# Patient Record
Sex: Female | Born: 1980 | Race: White | Hispanic: No | Marital: Married | Smoking: Former smoker
Health system: Southern US, Community
[De-identification: ages and names within clinical notes are randomized; demographics above are authoritative.]

## PROBLEM LIST (undated history)

## (undated) DIAGNOSIS — N200 Calculus of kidney: Secondary | ICD-10-CM

## (undated) HISTORY — PX: EYE SURGERY: SHX253

---
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
## 2001-01-06 ENCOUNTER — Encounter: Payer: Self-pay | Admitting: *Deleted

## 2001-01-06 ENCOUNTER — Emergency Department (HOSPITAL_COMMUNITY): Admission: EM | Admit: 2001-01-06 | Discharge: 2001-01-06 | Payer: Self-pay | Admitting: *Deleted

## 2001-06-11 ENCOUNTER — Other Ambulatory Visit: Admission: RE | Admit: 2001-06-11 | Discharge: 2001-06-11 | Payer: Self-pay | Admitting: Obstetrics & Gynecology

## 2001-11-20 ENCOUNTER — Encounter: Admission: RE | Admit: 2001-11-20 | Discharge: 2001-11-20 | Payer: Self-pay | Admitting: Family Medicine

## 2001-11-25 ENCOUNTER — Encounter: Admission: RE | Admit: 2001-11-25 | Discharge: 2001-11-25 | Payer: Self-pay | Admitting: Family Medicine

## 2001-11-26 ENCOUNTER — Encounter: Admission: RE | Admit: 2001-11-26 | Discharge: 2001-11-26 | Payer: Self-pay | Admitting: Sports Medicine

## 2001-11-26 ENCOUNTER — Encounter: Payer: Self-pay | Admitting: Sports Medicine

## 2001-12-16 ENCOUNTER — Encounter: Admission: RE | Admit: 2001-12-16 | Discharge: 2001-12-16 | Payer: Self-pay | Admitting: Family Medicine

## 2002-02-03 ENCOUNTER — Other Ambulatory Visit: Admission: RE | Admit: 2002-02-03 | Discharge: 2002-02-03 | Payer: Self-pay | Admitting: Obstetrics & Gynecology

## 2002-03-30 ENCOUNTER — Encounter: Payer: Self-pay | Admitting: Emergency Medicine

## 2002-03-30 ENCOUNTER — Emergency Department (HOSPITAL_COMMUNITY): Admission: EM | Admit: 2002-03-30 | Discharge: 2002-03-30 | Payer: Self-pay | Admitting: Emergency Medicine

## 2002-04-01 ENCOUNTER — Encounter: Admission: RE | Admit: 2002-04-01 | Discharge: 2002-04-01 | Payer: Self-pay | Admitting: Family Medicine

## 2002-04-02 ENCOUNTER — Emergency Department (HOSPITAL_COMMUNITY): Admission: EM | Admit: 2002-04-02 | Discharge: 2002-04-02 | Payer: Self-pay

## 2003-02-01 ENCOUNTER — Encounter: Admission: RE | Admit: 2003-02-01 | Discharge: 2003-02-01 | Payer: Self-pay | Admitting: Family Medicine

## 2003-02-22 ENCOUNTER — Emergency Department (HOSPITAL_COMMUNITY): Admission: EM | Admit: 2003-02-22 | Discharge: 2003-02-22 | Payer: Self-pay | Admitting: Emergency Medicine

## 2003-03-03 ENCOUNTER — Encounter: Admission: RE | Admit: 2003-03-03 | Discharge: 2003-03-03 | Payer: Self-pay | Admitting: Family Medicine

## 2004-02-16 ENCOUNTER — Emergency Department (HOSPITAL_COMMUNITY): Admission: EM | Admit: 2004-02-16 | Discharge: 2004-02-16 | Payer: Self-pay | Admitting: Emergency Medicine

## 2004-09-18 ENCOUNTER — Other Ambulatory Visit: Admission: RE | Admit: 2004-09-18 | Discharge: 2004-09-18 | Payer: Self-pay | Admitting: Obstetrics & Gynecology

## 2005-03-16 ENCOUNTER — Inpatient Hospital Stay (HOSPITAL_COMMUNITY): Admission: AD | Admit: 2005-03-16 | Discharge: 2005-03-18 | Payer: Self-pay | Admitting: Obstetrics & Gynecology

## 2005-04-29 ENCOUNTER — Inpatient Hospital Stay (HOSPITAL_COMMUNITY): Admission: AD | Admit: 2005-04-29 | Discharge: 2005-04-30 | Payer: Self-pay | Admitting: Obstetrics & Gynecology

## 2005-06-01 ENCOUNTER — Other Ambulatory Visit: Admission: RE | Admit: 2005-06-01 | Discharge: 2005-06-01 | Payer: Self-pay | Admitting: Obstetrics & Gynecology

## 2006-01-06 ENCOUNTER — Emergency Department (HOSPITAL_COMMUNITY): Admission: EM | Admit: 2006-01-06 | Discharge: 2006-01-06 | Payer: Self-pay | Admitting: Emergency Medicine

## 2007-02-01 ENCOUNTER — Emergency Department (HOSPITAL_COMMUNITY): Admission: EM | Admit: 2007-02-01 | Discharge: 2007-02-02 | Payer: Self-pay | Admitting: Emergency Medicine

## 2007-05-24 ENCOUNTER — Observation Stay (HOSPITAL_COMMUNITY): Admission: AD | Admit: 2007-05-24 | Discharge: 2007-05-25 | Payer: Self-pay | Admitting: Obstetrics & Gynecology

## 2007-07-09 ENCOUNTER — Ambulatory Visit (HOSPITAL_COMMUNITY): Admission: RE | Admit: 2007-07-09 | Discharge: 2007-07-09 | Payer: Self-pay | Admitting: Obstetrics and Gynecology

## 2007-07-31 ENCOUNTER — Inpatient Hospital Stay (HOSPITAL_COMMUNITY): Admission: AD | Admit: 2007-07-31 | Discharge: 2007-07-31 | Payer: Self-pay | Admitting: Obstetrics and Gynecology

## 2007-08-10 ENCOUNTER — Inpatient Hospital Stay (HOSPITAL_COMMUNITY): Admission: AD | Admit: 2007-08-10 | Discharge: 2007-08-12 | Payer: Self-pay | Admitting: Obstetrics & Gynecology

## 2007-10-10 ENCOUNTER — Emergency Department (HOSPITAL_COMMUNITY): Admission: EM | Admit: 2007-10-10 | Discharge: 2007-10-10 | Payer: Self-pay | Admitting: Emergency Medicine

## 2008-03-11 IMAGING — US US RENAL
1 series · 13 of 25 positions shown · non-contrast
Comparison: 05/24/07

CLINICAL DATA: Right-sided flank pain and hematuria. History of renal calculi. 32 weeks pregnant.
RENAL/URINARY TRACT ULTRASOUND AND TRANSVAGINAL OBSTETRICAL US:
TECHNIQUE: Complete ultrasound examination of the urinary tract was performed including evaluation of the kidneys, renal collecting systems, and urinary bladder. Transvaginal sonography was also performed to assess the distal ureters and region of the ureteropelvic junctions.

[Series 1: us renal · 0.28mm/px · 13 of 42 slices shown]
[im 1/42]
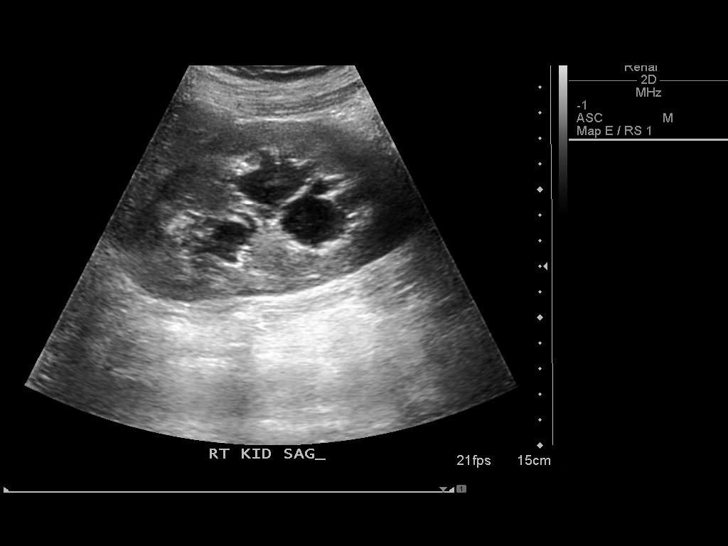
[im 4/42]
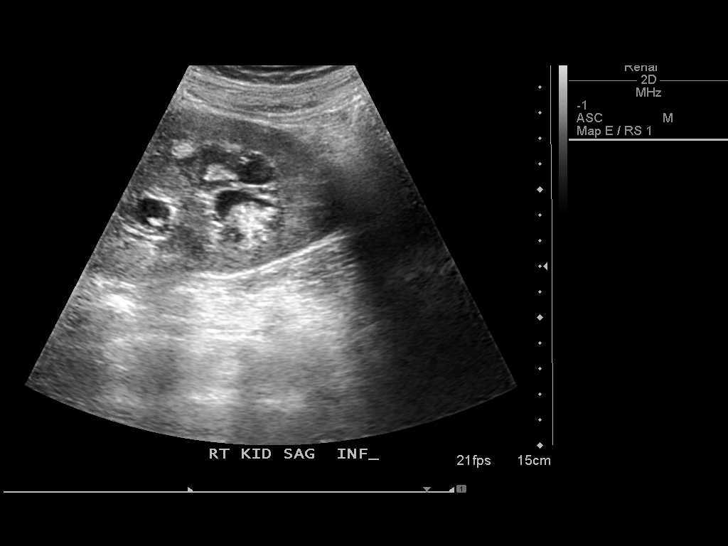
[im 7/42]
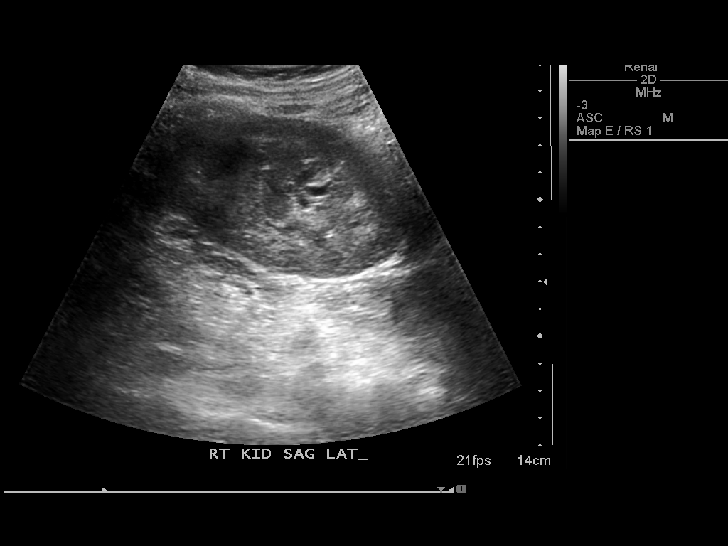
[im 11/42]
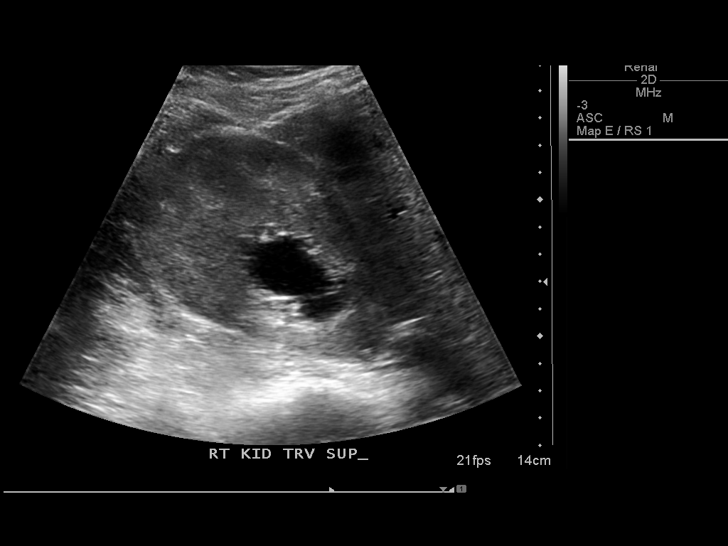
[im 14/42]
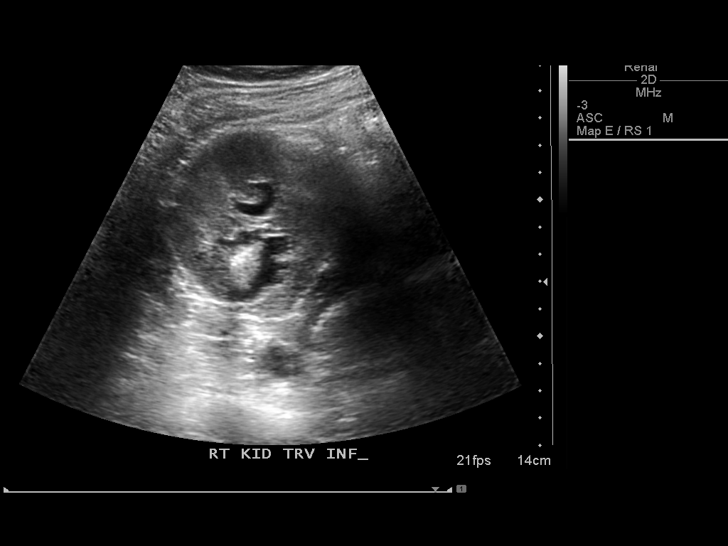
[im 18/42]
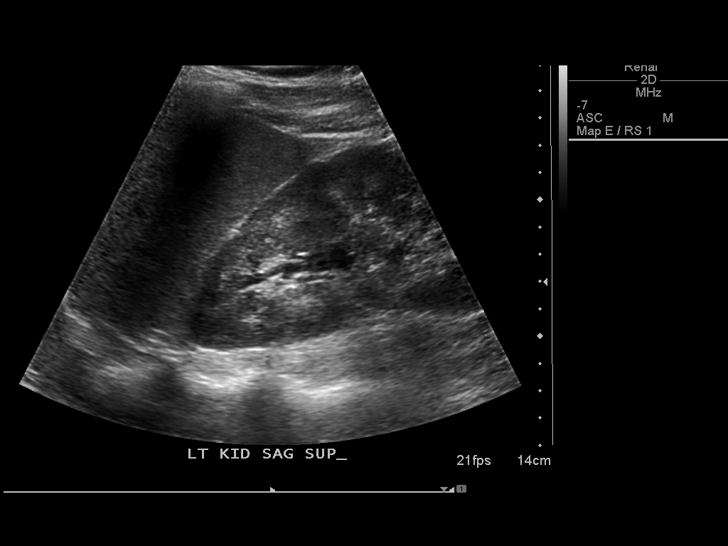
[im 21/42]
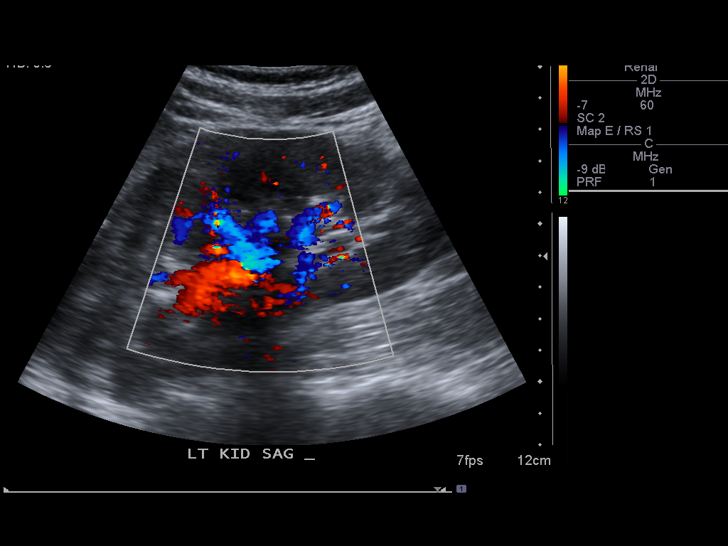
[im 24/42]
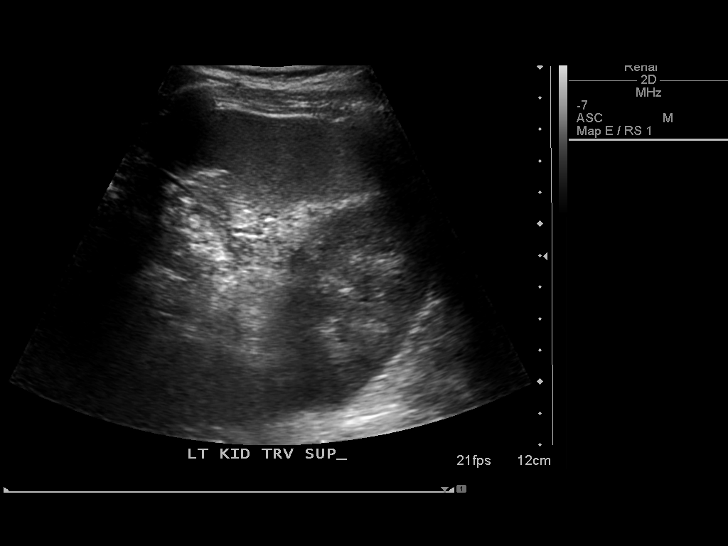
[im 28/42]
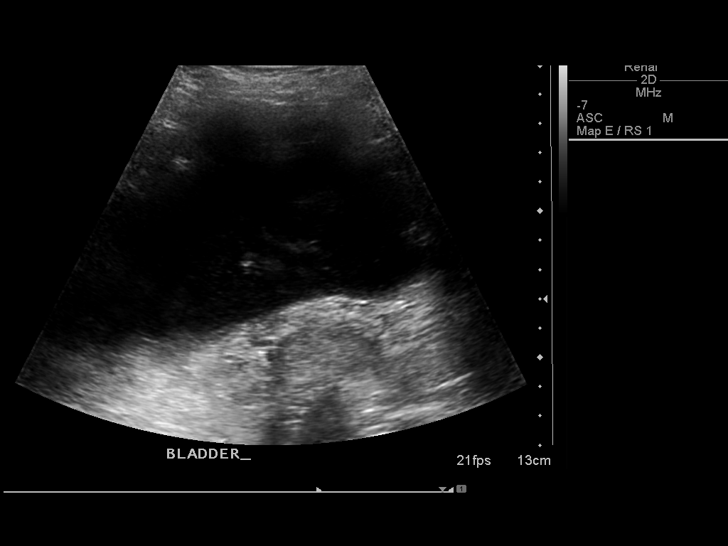
[im 31/42]
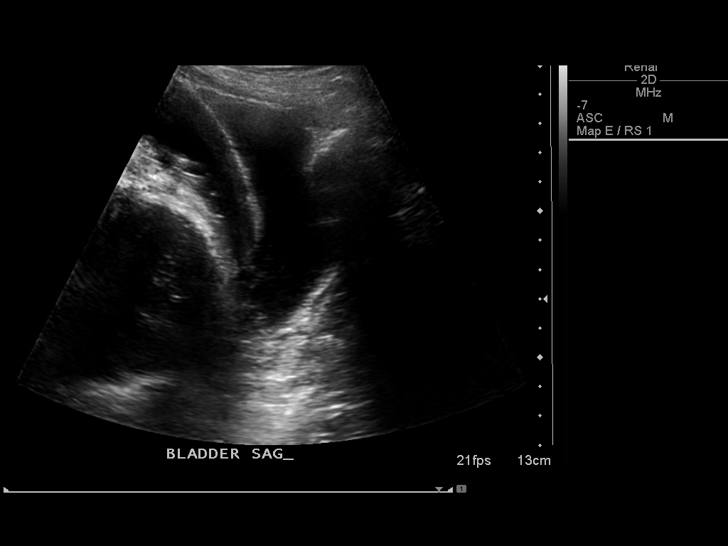
[im 35/42]
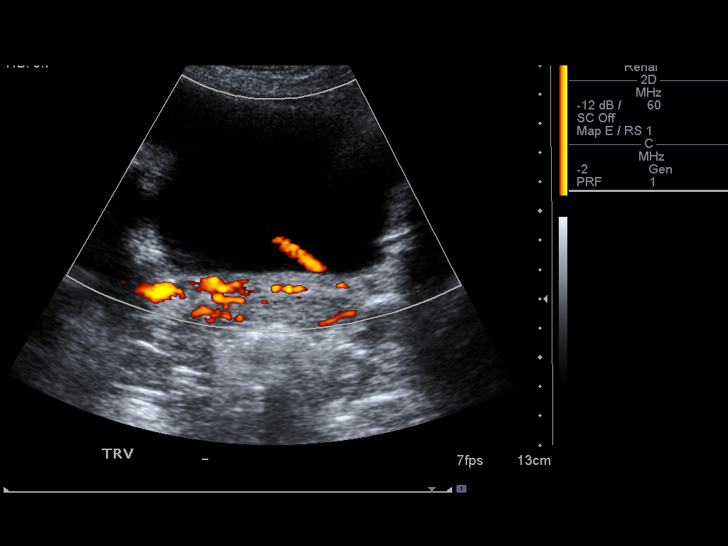
[im 38/42]
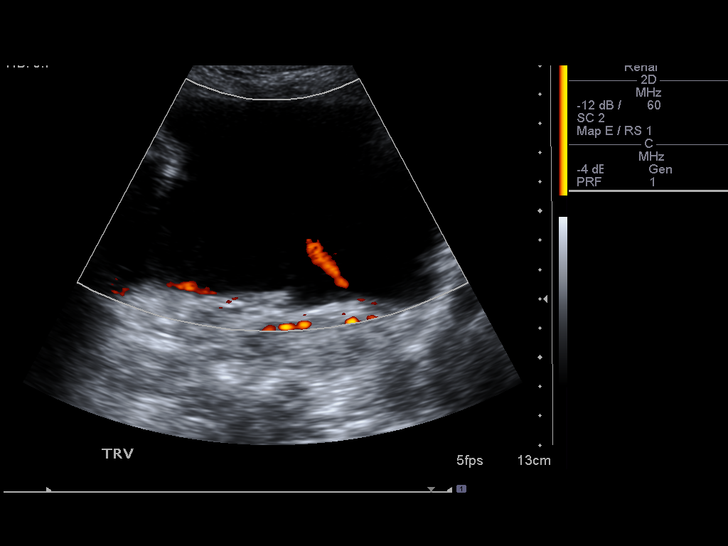
[im 42/42]
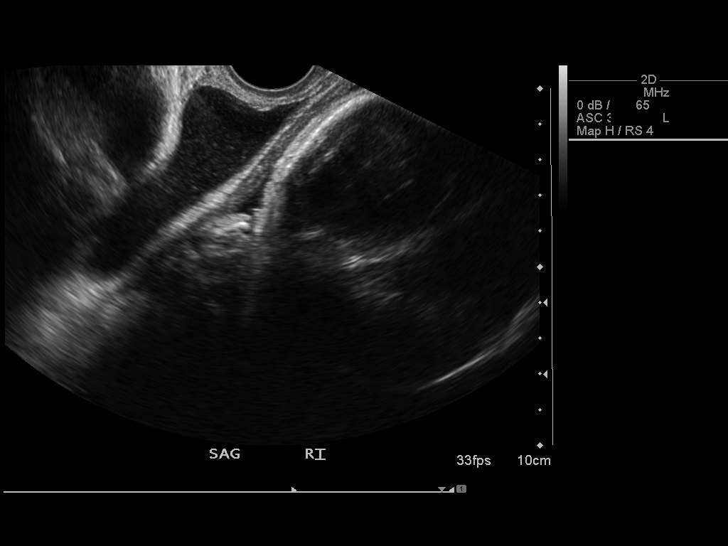

[13 of 25 positions shown; findings below may reference images not displayed]

FINDINGS: The left kidney is normal in size and appearance and there is no evidence of left-sided hydronephrosis. 
Moderate right-sided hydronephrosis is seen as well as proximal ureterectasis.   This appears mildly increased compared with prior study on 05/24/07. At least one shadowing calculus is seen in the lower pole of the right kidney, which measures 1.3 cm.  No calculi are seen in the right renal pelvis or visualized portion of the proximal right ureter. 
Evaluation of the bladder and distal ureters was performed by both transabdominal and transvaginal sonography. A fetus was seen in cephalic presentation. A left ureteral jet is visualized; however, the right ureteral jet is diminished. No calculi are identified within the distal ureters or ureteropelvic junction regions by transvaginal sonography.
IMPRESSION: 1.  Moderate right hydronephrosis and proximal ureterectasis, which is mildly increased since prior study. Right ureteral jet is diminished, suspicious for right ureteral obstruction. Etiology is not identified by transabdominal or transvaginal ultrasound. 
2.  1.3 cm calculus in the lower pole of the right kidney. 
3.  Normal left kidney.  No evidence of hydronephrosis. 
4.  Fetus in cephalic presentation.

## 2008-05-09 ENCOUNTER — Emergency Department (HOSPITAL_COMMUNITY): Admission: EM | Admit: 2008-05-09 | Discharge: 2008-05-09 | Payer: Self-pay | Admitting: Emergency Medicine

## 2009-02-25 ENCOUNTER — Encounter: Admission: RE | Admit: 2009-02-25 | Discharge: 2009-02-25 | Payer: Self-pay | Admitting: Family Medicine

## 2009-05-02 ENCOUNTER — Emergency Department (HOSPITAL_BASED_OUTPATIENT_CLINIC_OR_DEPARTMENT_OTHER): Admission: EM | Admit: 2009-05-02 | Discharge: 2009-05-02 | Payer: Self-pay | Admitting: Emergency Medicine

## 2009-05-16 ENCOUNTER — Ambulatory Visit: Payer: Self-pay | Admitting: Diagnostic Radiology

## 2009-05-16 ENCOUNTER — Emergency Department (HOSPITAL_BASED_OUTPATIENT_CLINIC_OR_DEPARTMENT_OTHER): Admission: EM | Admit: 2009-05-16 | Discharge: 2009-05-16 | Payer: Self-pay | Admitting: Emergency Medicine

## 2009-06-08 ENCOUNTER — Encounter: Admission: RE | Admit: 2009-06-08 | Discharge: 2009-06-08 | Payer: Self-pay | Admitting: Family Medicine

## 2009-07-07 ENCOUNTER — Encounter: Admission: RE | Admit: 2009-07-07 | Discharge: 2009-09-08 | Payer: Self-pay | Admitting: Sports Medicine

## 2009-08-09 ENCOUNTER — Encounter: Admission: RE | Admit: 2009-08-09 | Discharge: 2009-08-09 | Payer: Self-pay | Admitting: Obstetrics and Gynecology

## 2009-08-27 ENCOUNTER — Emergency Department (HOSPITAL_BASED_OUTPATIENT_CLINIC_OR_DEPARTMENT_OTHER): Admission: EM | Admit: 2009-08-27 | Discharge: 2009-08-27 | Payer: Self-pay | Admitting: Emergency Medicine

## 2009-08-27 ENCOUNTER — Ambulatory Visit: Payer: Self-pay | Admitting: Diagnostic Radiology

## 2009-10-02 ENCOUNTER — Emergency Department (HOSPITAL_COMMUNITY): Admission: EM | Admit: 2009-10-02 | Discharge: 2009-10-02 | Payer: Self-pay | Admitting: Emergency Medicine

## 2009-11-01 ENCOUNTER — Encounter
Admission: RE | Admit: 2009-11-01 | Discharge: 2010-01-30 | Payer: Self-pay | Admitting: Physical Medicine and Rehabilitation

## 2010-04-27 ENCOUNTER — Ambulatory Visit: Payer: Self-pay | Admitting: Gastroenterology

## 2010-06-05 ENCOUNTER — Other Ambulatory Visit: Payer: Self-pay | Admitting: Obstetrics and Gynecology

## 2010-06-05 DIAGNOSIS — Z09 Encounter for follow-up examination after completed treatment for conditions other than malignant neoplasm: Secondary | ICD-10-CM

## 2010-06-09 ENCOUNTER — Ambulatory Visit
Admission: RE | Admit: 2010-06-09 | Discharge: 2010-06-09 | Disposition: A | Discharge status: Home / Self Care | Payer: Self-pay | Source: Ambulatory Visit | Attending: Obstetrics and Gynecology | Admitting: Obstetrics and Gynecology

## 2010-06-09 DIAGNOSIS — Z09 Encounter for follow-up examination after completed treatment for conditions other than malignant neoplasm: Secondary | ICD-10-CM

## 2010-07-23 LAB — URINALYSIS, ROUTINE W REFLEX MICROSCOPIC
Bilirubin Urine: NEGATIVE
Glucose, UA: NEGATIVE mg/dL
Hgb urine dipstick: NEGATIVE
Ketones, ur: NEGATIVE mg/dL
Leukocytes, UA: NEGATIVE
pH: 6.5 (ref 5.0–8.0)

## 2010-07-23 LAB — URINE MICROSCOPIC-ADD ON

## 2010-07-23 LAB — URINE CULTURE

## 2010-07-23 LAB — PREGNANCY, URINE: Preg Test, Ur: NEGATIVE

## 2010-08-07 LAB — URINALYSIS, ROUTINE W REFLEX MICROSCOPIC
Bilirubin Urine: NEGATIVE
Hgb urine dipstick: NEGATIVE
Ketones, ur: NEGATIVE mg/dL
Nitrite: NEGATIVE
Protein, ur: NEGATIVE mg/dL
Specific Gravity, Urine: 1.008 (ref 1.005–1.030)
Urobilinogen, UA: 0.2 mg/dL (ref 0.0–1.0)
pH: 6.5 (ref 5.0–8.0)

## 2010-08-07 LAB — PREGNANCY, URINE: Preg Test, Ur: NEGATIVE

## 2010-08-21 LAB — URINE MICROSCOPIC-ADD ON

## 2010-08-21 LAB — URINALYSIS, ROUTINE W REFLEX MICROSCOPIC
Bilirubin Urine: NEGATIVE
Glucose, UA: NEGATIVE mg/dL
Hgb urine dipstick: NEGATIVE
Ketones, ur: NEGATIVE mg/dL
Nitrite: NEGATIVE
Protein, ur: NEGATIVE mg/dL
Specific Gravity, Urine: 1.007 (ref 1.005–1.030)
Urobilinogen, UA: 0.2 mg/dL (ref 0.0–1.0)
pH: 7.5 (ref 5.0–8.0)

## 2010-08-21 LAB — CBC
HCT: 44.3 % (ref 36.0–46.0)
Hemoglobin: 14.8 g/dL (ref 12.0–15.0)
MCHC: 33.5 g/dL (ref 30.0–36.0)
MCV: 86.9 fL (ref 78.0–100.0)
Platelets: 137 10*3/uL — ABNORMAL LOW (ref 150–400)
RBC: 5.1 MIL/uL (ref 3.87–5.11)
RDW: 11.9 % (ref 11.5–15.5)
WBC: 8.5 10*3/uL (ref 4.0–10.5)

## 2010-08-21 LAB — POCT I-STAT, CHEM 8
Hemoglobin: 16.3 g/dL — ABNORMAL HIGH (ref 12.0–15.0)
Sodium: 141 mEq/L (ref 135–145)
TCO2: 26 mmol/L (ref 0–100)

## 2010-08-21 LAB — DIFFERENTIAL
Basophils Absolute: 0 10*3/uL (ref 0.0–0.1)
Basophils Relative: 0 % (ref 0–1)
Eosinophils Absolute: 0.2 10*3/uL (ref 0.0–0.7)
Eosinophils Relative: 2 % (ref 0–5)
Lymphocytes Relative: 22 % (ref 12–46)
Lymphs Abs: 1.9 10*3/uL (ref 0.7–4.0)
Monocytes Absolute: 0.6 10*3/uL (ref 0.1–1.0)
Monocytes Relative: 7 % (ref 3–12)
Neutro Abs: 5.8 10*3/uL (ref 1.7–7.7)
Neutrophils Relative %: 68 % (ref 43–77)

## 2010-08-21 LAB — POCT PREGNANCY, URINE: Preg Test, Ur: NEGATIVE

## 2010-09-06 ENCOUNTER — Other Ambulatory Visit (HOSPITAL_COMMUNITY): Payer: Self-pay | Admitting: Neurological Surgery

## 2010-09-06 DIAGNOSIS — Q050 Cervical spina bifida with hydrocephalus: Secondary | ICD-10-CM

## 2010-09-11 ENCOUNTER — Other Ambulatory Visit (HOSPITAL_COMMUNITY): Payer: Medicaid Other

## 2010-09-11 ENCOUNTER — Ambulatory Visit (HOSPITAL_COMMUNITY)
Admission: RE | Admit: 2010-09-11 | Discharge: 2010-09-11 | Disposition: A | Discharge status: Home / Self Care | Payer: Medicaid Other | Source: Ambulatory Visit | Attending: Neurological Surgery | Admitting: Neurological Surgery

## 2010-09-11 ENCOUNTER — Other Ambulatory Visit (HOSPITAL_COMMUNITY): Payer: Self-pay | Admitting: Neurological Surgery

## 2010-09-11 DIAGNOSIS — Q050 Cervical spina bifida with hydrocephalus: Secondary | ICD-10-CM

## 2010-09-11 DIAGNOSIS — Q048 Other specified congenital malformations of brain: Secondary | ICD-10-CM | POA: Insufficient documentation

## 2010-09-11 DIAGNOSIS — Q054 Unspecified spina bifida with hydrocephalus: Secondary | ICD-10-CM | POA: Insufficient documentation

## 2010-09-19 NOTE — Discharge Summary (Signed)
NAMEJAZARIAH, Carla Singleton             ACCOUNT NO.:  [id number]   MEDICAL RECORD NO.:  [id number]          PATIENT TYPE:  INP   LOCATION:  9304                          FACILITY:  [facility]   PHYSICIAN:  Kendra H. Tenny Craw, MD     DATE OF BIRTH:  November 05, 1980   DATE OF ADMISSION:  05/24/2007  DATE OF DISCHARGE:  05/25/2007                               DISCHARGE SUMMARY   ATTENDING PHYSICIAN:  Kendra H. Tenny Craw, MD.   FINAL DIAGNOSES:  1. Left renal kidney stone x2.  2. Left lower quadrant pain.  3. Pain control.   SUMMARY:  Ms. Carla Singleton is a 30 year old G2, P1 at [redacted] weeks  pregnant who presented complaining of severe left-sided pain that  started approximately 3:00 on May 24, 2007.  The pain became  intolerable around 5:00 in the morning and she presented to the  maternity admissions unit for evaluation.  At that time she initially  had difficulty obtaining adequate pain control with IV Dilaudid and at  that time she also was experiencing urinary urgency but very small  volumes of urine were only able to be voided.  A urinalysis showed  leukocyte esterase and bacteria and white cells too numerous to count.  A CBC demonstrated a white count of 18.  A renal ultrasound demonstrated  2 stones in the lower pole of the left kidney, a normal appearing  ureteral jet on the right side and a slightly diminished ureteral jet on  the left side.  The patient was admitted for pain control for a presumed  left-sided ureteral stone and actually did manage to achieve adequate  pain control with oral Percocet with oxycodone for breakthrough.  Additionally, the patient's case was discussed with the urologist on  call who suggested adding Flomax to assist with passage of the stone if  there if there is a stone present in lower ureteral segment.  Once the  patient received Flomax her urine output increased significantly and she  was feeling much better.  The patient's urine was strained throughout  the  hospitalization.  No stone was passed during the hospitalization but  she did have a significant amount of sediment within the urine.  She is  being discharged home on January 18, afebrile and comfortable on oral  pain medications.   She is instructed to follow up in the office at the end of this week.  She will take Percocet 1-2 tablets every 4-6 hours as needed for pain  with oxycodone 5-10 mg every 4 hours as needed for breakthrough.  She  will also take Keflex 500 mg p.o. q.i.d. for 7 days followed by daily  Macrobid.   LABS AT DISCHARGE:  Sodium 134, potassium 3.5, chloride 100, CO2 25, BUN  6, creatinine 0.51.      Freddrick March. Tenny Craw, MD  Electronically Signed     KHR/MEDQ  D:  05/25/2007  T:  05/25/2007  Job:  [id number]

## 2010-09-22 NOTE — Discharge Summary (Signed)
Carla Singleton, Carla Singleton             ACCOUNT NO.:  [id number]   MEDICAL RECORD NO.:  [id number]          PATIENT TYPE:  INP   LOCATION:  9152                          FACILITY:  [facility]   PHYSICIAN:  Malva Limes, M.D.    DATE OF BIRTH:  1981-04-22   DATE OF ADMISSION:  03/16/2005  DATE OF DISCHARGE:  03/18/2005                                 DISCHARGE SUMMARY   FINAL DIAGNOSES:  1.  Intrauterine gestation at 32 weeks.  2.  Right lower quadrant pain.  3.  Nephrolithiasis and pyelonephritis. Complications none.   HOSPITAL COURSE:  This 30 year old G1, P0 presents with [redacted] weeks gestation  complaining of right lower quadrant pain with radiation to the back. The  patient has had some nausea and vomiting, no bowel complaints at this time.  The patient also has been having some urinary frequency but no dysuria upon  admission. An exam and labs were performed. The patient had an elevated  white count and was definitely tender in her right lower quadrant with  guarding at this point. An ultrasound was ordered. The patient's pain was  managed with medicine. She remained afebrile. Pyelonephritis started  improving but her kidney stone had not yet passed.  A urology consultation  was obtained to see if there is anything else we could do for the patient.  The urologist thought it was reasonable to observe the patient at this time  and use pain control.  She was felt ready for discharge on March 18, 2005.   DISPOSITION:  She was sent home on a regular diet, told to decrease her  activity and was given a prescription for Dilaudid 2-4 milligrams every 4  hours as needed for pain, and of course to continue her vitamins. To call  with any increased pain, fevers or bleeding.   FOLLOW UP:  She was to follow up in our office on March 26, 2005.   Labs on discharge - the patient had a hemoglobin of 10.8, white blood cell  count of 14.5  thousand which had dropped from 18.5 thousand on November  10,  and platelets of 203,000. Her urine culture also came back with no growth.      Leilani Able, P.A.-C.    ______________________________  Malva Limes, M.D.    MB/MEDQ  D:  04/11/2005  T:  04/11/2005  Job:  [id number]

## 2010-11-11 ENCOUNTER — Encounter (HOSPITAL_BASED_OUTPATIENT_CLINIC_OR_DEPARTMENT_OTHER): Payer: Self-pay | Admitting: *Deleted

## 2010-11-11 ENCOUNTER — Emergency Department (INDEPENDENT_AMBULATORY_CARE_PROVIDER_SITE_OTHER): Payer: No Typology Code available for payment source

## 2010-11-11 ENCOUNTER — Emergency Department (HOSPITAL_BASED_OUTPATIENT_CLINIC_OR_DEPARTMENT_OTHER)
Admission: EM | Admit: 2010-11-11 | Discharge: 2010-11-12 | Disposition: A | Discharge status: Home / Self Care | Payer: No Typology Code available for payment source | Attending: Emergency Medicine | Admitting: Emergency Medicine

## 2010-11-11 DIAGNOSIS — M25539 Pain in unspecified wrist: Secondary | ICD-10-CM

## 2010-11-11 DIAGNOSIS — W010XXA Fall on same level from slipping, tripping and stumbling without subsequent striking against object, initial encounter: Secondary | ICD-10-CM | POA: Insufficient documentation

## 2010-11-11 DIAGNOSIS — F17200 Nicotine dependence, unspecified, uncomplicated: Secondary | ICD-10-CM | POA: Insufficient documentation

## 2010-11-11 DIAGNOSIS — S63509A Unspecified sprain of unspecified wrist, initial encounter: Secondary | ICD-10-CM | POA: Insufficient documentation

## 2010-11-11 DIAGNOSIS — Y9229 Other specified public building as the place of occurrence of the external cause: Secondary | ICD-10-CM | POA: Insufficient documentation

## 2010-11-11 HISTORY — DX: Calculus of kidney: N20.0

## 2010-11-11 MED ORDER — OXYCODONE-ACETAMINOPHEN 5-325 MG PO TABS
1.0000 | ORAL_TABLET | Freq: Once | ORAL | Status: AC
  Administered 2010-11-11: 1 via ORAL
  Filled 2010-11-11: qty 1

## 2010-11-11 MED ORDER — OXYCODONE-ACETAMINOPHEN 5-325 MG PO TABS: 1.0000 | ORAL_TABLET | ORAL | Status: AC | PRN

## 2010-11-11 NOTE — ED Provider Notes (Signed)
History     Chief Complaint  Patient presents with  . Wrist Pain    was at a restaurant and slipped near a leaking drink machine and caught herself with her left hand and now having severe pain and swelling in left wrist   Patient is a 30 y.o. female presenting with fall. The history is provided by the patient.  Fall The accident occurred 3 to 5 hours ago. The fall occurred while walking. She fell from a height of 1 to 2 ft. She landed on a hard floor. The point of impact was the left wrist and right knee. The pain is present in the left wrist. The pain is moderate. She was ambulatory at the scene. Pertinent negatives include no fever, no numbness and no headaches. The symptoms are aggravated by use of the injured limb.  slip and fall. Had left wrist and right knee pain. Left wrist pain remains. No numbness or weakness.   Past Medical History  Diagnosis Date  . Kidney stones     History reviewed. No pertinent past surgical history.  History reviewed. No pertinent family history.  History  Substance Use Topics  . Smoking status: Current Everyday Smoker  . Smokeless tobacco: Not on file  . Alcohol Use: Yes     occassionally    OB History    Grav Para Term Preterm Abortions TAB SAB Ect Mult Living                  Review of Systems  Constitutional: Negative for fever.  Respiratory: Negative for apnea and shortness of breath.   Cardiovascular: Negative for chest pain.  Musculoskeletal: Negative for back pain and gait problem.       Left wrist pain.   Neurological: Negative for weakness, numbness and headaches.    Physical Exam  BP 101/61  Pulse 91  Temp(Src) 98.5 F (36.9 C) (Oral)  Resp 19  Ht 5' 11.5" (1.816 m)  Wt 130 lb (58.968 kg)  BMI 17.88 kg/m2  SpO2 99%  LMP 10/19/2010  Physical Exam  Constitutional: She is oriented to person, place, and time. She appears well-developed and well-nourished.  Eyes: Pupils are equal, round, and reactive to light.  Neck:  Normal range of motion. Neck supple.  Cardiovascular: Normal rate.   Pulmonary/Chest: Effort normal.  Musculoskeletal: Normal range of motion. She exhibits tenderness.       Left wrist: She exhibits tenderness and bony tenderness (tender over snuff box. ). She exhibits no effusion, no crepitus and no deformity.  Neurological: She is alert and oriented to person, place, and time.    ED Course  Procedures  MDM Fall with left wrist pain. Snuff box tenderness. Xray negative, but splinted. Hand follow up.       Juliet Rude. Rubin Payor, MD 11/11/10 [phone]

## 2010-11-12 NOTE — ED Notes (Signed)
+  PMS post splint application 

## 2011-01-25 LAB — URINALYSIS, ROUTINE W REFLEX MICROSCOPIC
Bilirubin Urine: NEGATIVE
Glucose, UA: NEGATIVE
Ketones, ur: NEGATIVE
Nitrite: NEGATIVE
Protein, ur: NEGATIVE
Specific Gravity, Urine: 1.025
Urobilinogen, UA: 0.2
pH: 6

## 2011-01-25 LAB — COMPREHENSIVE METABOLIC PANEL
Albumin: 2.7 — ABNORMAL LOW
BUN: 6
Chloride: 100
Creatinine, Ser: 0.51
Total Bilirubin: 0.8
Total Protein: 5.7 — ABNORMAL LOW

## 2011-01-25 LAB — URINE MICROSCOPIC-ADD ON

## 2011-01-25 LAB — CBC
HCT: 33.4 — ABNORMAL LOW
MCV: 89.4
Platelets: 157
RDW: 12.3

## 2011-01-29 LAB — URINALYSIS, ROUTINE W REFLEX MICROSCOPIC
Hgb urine dipstick: NEGATIVE
Specific Gravity, Urine: 1.015
Urobilinogen, UA: 0.2

## 2011-01-30 LAB — CBC
Hemoglobin: 11.7 — ABNORMAL LOW
MCV: 85.6
RBC: 3.92
RBC: 4.03
WBC: 12.1 — ABNORMAL HIGH
WBC: 15.8 — ABNORMAL HIGH

## 2011-01-30 LAB — CCBB MATERNAL DONOR DRAW

## 2011-01-30 LAB — RPR: RPR Ser Ql: NONREACTIVE

## 2011-06-14 ENCOUNTER — Ambulatory Visit: Payer: No Typology Code available for payment source | Admitting: Gastroenterology

## 2011-07-19 ENCOUNTER — Ambulatory Visit: Payer: No Typology Code available for payment source | Admitting: Gastroenterology

## 2011-12-04 ENCOUNTER — Emergency Department (HOSPITAL_BASED_OUTPATIENT_CLINIC_OR_DEPARTMENT_OTHER): Payer: Medicaid Other

## 2011-12-04 ENCOUNTER — Encounter (HOSPITAL_BASED_OUTPATIENT_CLINIC_OR_DEPARTMENT_OTHER): Payer: Self-pay

## 2011-12-04 ENCOUNTER — Other Ambulatory Visit: Payer: Self-pay

## 2011-12-04 ENCOUNTER — Emergency Department (HOSPITAL_BASED_OUTPATIENT_CLINIC_OR_DEPARTMENT_OTHER)
Admission: EM | Admit: 2011-12-04 | Discharge: 2011-12-04 | Disposition: A | Discharge status: Home / Self Care | Payer: Medicaid Other | Attending: Emergency Medicine | Admitting: Emergency Medicine

## 2011-12-04 DIAGNOSIS — F17200 Nicotine dependence, unspecified, uncomplicated: Secondary | ICD-10-CM | POA: Insufficient documentation

## 2011-12-04 DIAGNOSIS — J40 Bronchitis, not specified as acute or chronic: Secondary | ICD-10-CM

## 2011-12-04 LAB — BASIC METABOLIC PANEL
BUN: 10 mg/dL (ref 6–23)
CO2: 29 mEq/L (ref 19–32)
Calcium: 9.3 mg/dL (ref 8.4–10.5)
Chloride: 103 mEq/L (ref 96–112)
Creatinine, Ser: 0.6 mg/dL (ref 0.50–1.10)
GFR calc Af Amer: >90 mL/min (ref >90)
GFR calc non Af Amer: >90 mL/min (ref >90)
Glucose, Bld: 94 mg/dL (ref 70–99)
Potassium: 3.9 mEq/L (ref 3.5–5.1)
Sodium: 139 mEq/L (ref 135–145)

## 2011-12-04 LAB — CBC WITH DIFFERENTIAL/PLATELET
Basophils Absolute: 0 10*3/uL (ref 0.0–0.1)
Basophils Relative: 0 % (ref 0–1)
Eosinophils Absolute: 0.4 10*3/uL (ref 0.0–0.7)
Eosinophils Relative: 4 % (ref 0–5)
HCT: 38.7 % (ref 36.0–46.0)
Hemoglobin: 13.6 g/dL (ref 12.0–15.0)
Lymphocytes Relative: 25 % (ref 12–46)
Lymphs Abs: 2.4 10*3/uL (ref 0.7–4.0)
MCH: 29.4 pg (ref 26.0–34.0)
MCHC: 35.1 g/dL (ref 30.0–36.0)
MCV: 83.8 fL (ref 78.0–100.0)
Monocytes Absolute: 0.5 10*3/uL (ref 0.1–1.0)
Monocytes Relative: 5 % (ref 3–12)
Neutro Abs: 6.4 10*3/uL (ref 1.7–7.7)
Neutrophils Relative %: 66 % (ref 43–77)
Platelets: 148 10*3/uL — ABNORMAL LOW (ref 150–400)
RBC: 4.62 MIL/uL (ref 3.87–5.11)
RDW: 11.9 % (ref 11.5–15.5)
WBC: 9.7 10*3/uL (ref 4.0–10.5)

## 2011-12-04 MED ORDER — ALBUTEROL SULFATE HFA 108 (90 BASE) MCG/ACT IN AERS: 1.0000 | INHALATION_SPRAY | Freq: Four times a day (QID) | RESPIRATORY_TRACT | Status: DC | PRN

## 2011-12-04 MED ORDER — DOXYCYCLINE HYCLATE 100 MG PO CAPS: 100.0000 mg | ORAL_CAPSULE | Freq: Two times a day (BID) | ORAL | Status: AC

## 2011-12-04 MED ORDER — ALBUTEROL SULFATE HFA 108 (90 BASE) MCG/ACT IN AERS
2.0000 | INHALATION_SPRAY | Freq: Once | RESPIRATORY_TRACT | Status: AC
  Administered 2011-12-04: 2 via RESPIRATORY_TRACT
  Filled 2011-12-04: qty 6.7

## 2011-12-04 NOTE — ED Notes (Signed)
Reports head and chest congestion for past week.  Noticed that past couple days it is progressing and causing her to be SOB with exertion.  C/o painful inspiration.

## 2011-12-04 NOTE — ED Notes (Signed)
C/o sob, prod cough, chills x 3-4 days-smoker-has cont'd to smoke with c/o-NAD at present

## 2011-12-04 NOTE — ED Provider Notes (Signed)
History     CSN: [id number]  Arrival date & time 12/04/11  1240   First MD Initiated Contact with Patient 12/04/11 1335      Chief Complaint  Patient presents with  . Shortness of Breath  . Cough    (Consider location/radiation/quality/duration/timing/severity/associated sxs/prior treatment) HPI Comments: Active smoker presents with 3-4 days of shortness of breath, pleuritic chest pain, productive cough of clear mucus and specks of blood, and chills. No abdominal pain, nausea or vomiting. She's felt chills but not had any fevers. She continues to smoke. Feels SOB that is worse with exertion.  The history is provided by the patient.    Past Medical History  Diagnosis Date  . Kidney stones     History reviewed. No pertinent past surgical history.  No family history on file.  History  Substance Use Topics  . Smoking status: Current Everyday Smoker  . Smokeless tobacco: Not on file  . Alcohol Use: Yes     occassionally    OB History    Grav Para Term Preterm Abortions TAB SAB Ect Mult Living                  Review of Systems  Constitutional: Negative for fever, activity change and appetite change.  HENT: Positive for congestion and rhinorrhea. Negative for sore throat.   Respiratory: Positive for cough and shortness of breath.   Cardiovascular: Positive for chest pain.  Gastrointestinal: Negative for nausea, vomiting and abdominal pain.  Genitourinary: Negative for dysuria, hematuria, vaginal bleeding and vaginal discharge.  Musculoskeletal: Negative for back pain.  Skin: Negative for rash.    Allergies  Review of patient's allergies indicates no known allergies.  Home Medications   Current Outpatient Rx  Name Route Sig Dispense Refill  . HYDROCODONE-ACETAMINOPHEN 10-325 MG PO TABS Oral Take 1 tablet by mouth 2 (two) times daily.       BP 126/82  Pulse 74  Temp 98.3 F (36.8 C) (Oral)  Resp 16  Ht 5\' 11"  (1.803 m)  Wt 125 lb (56.7 kg)  BMI 17.43  kg/m2  SpO2 100%  Physical Exam  Constitutional: She is oriented to person, place, and time. She appears well-developed and well-nourished. No distress.  HENT:  Head: Normocephalic and atraumatic.  Mouth/Throat: Oropharynx is clear and moist. No oropharyngeal exudate.  Eyes: Conjunctivae are normal. Pupils are equal, round, and reactive to light.  Neck: Normal range of motion. Neck supple.  Cardiovascular: Normal rate, regular rhythm and normal heart sounds.   No murmur heard. Pulmonary/Chest: Effort normal and breath sounds normal. No respiratory distress. She has no wheezes.  Abdominal: Soft. There is no tenderness. There is no rebound and no guarding.  Musculoskeletal: Normal range of motion. She exhibits no edema and no tenderness.  Neurological: She is alert and oriented to person, place, and time. No cranial nerve deficit.  Skin: Skin is warm.    ED Course  Procedures (including critical care time)  Labs Reviewed  CBC WITH DIFFERENTIAL - Abnormal; Notable for the following:    Platelets 148 (*)     All other components within normal limits  D-DIMER, QUANTITATIVE  BASIC METABOLIC PANEL   Dg Chest 2 View  12/04/2011  *RADIOLOGY REPORT*  Clinical Data: Cough, chills and shortness of breath.  CHEST - 2 VIEW  Comparison: None.  Findings: Trachea is midline.  Heart size normal.  Lungs are clear. No pleural fluid.  Pectus deformity.  IMPRESSION: No acute findings.  Original Report Authenticated  By: Reyes Ivan, M.D.     No diagnosis found.    MDM  Cough, congestion, shortness of breath, hemoptysis. Vital stable, no distress.  Suspect bronchitis. Given albuterol, chest x-ray, d-dimer given mirena use and history of hemoptysis.  CXR negative. D-dimer negative. Will treat for bronchitis, smoking cessation encouraged.    Date: 12/04/2011  Rate: 55  Rhythm: sinus bradycardia and premature atrial contractions (PAC)  QRS Axis: normal  Intervals: normal  ST/T Wave  abnormalities: normal  Conduction Disutrbances:none  Narrative Interpretation:   Old EKG Reviewed: none available     Glynn Octave, MD 12/04/11 1501

## 2012-01-04 ENCOUNTER — Encounter (HOSPITAL_BASED_OUTPATIENT_CLINIC_OR_DEPARTMENT_OTHER): Payer: Self-pay | Admitting: *Deleted

## 2012-01-04 ENCOUNTER — Emergency Department (HOSPITAL_BASED_OUTPATIENT_CLINIC_OR_DEPARTMENT_OTHER)
Admission: EM | Admit: 2012-01-04 | Discharge: 2012-01-04 | Disposition: A | Discharge status: Home / Self Care | Payer: Medicaid Other | Attending: Emergency Medicine | Admitting: Emergency Medicine

## 2012-01-04 ENCOUNTER — Emergency Department (HOSPITAL_BASED_OUTPATIENT_CLINIC_OR_DEPARTMENT_OTHER): Payer: Medicaid Other

## 2012-01-04 DIAGNOSIS — R109 Unspecified abdominal pain: Secondary | ICD-10-CM | POA: Insufficient documentation

## 2012-01-04 DIAGNOSIS — Z79899 Other long term (current) drug therapy: Secondary | ICD-10-CM | POA: Insufficient documentation

## 2012-01-04 DIAGNOSIS — R319 Hematuria, unspecified: Secondary | ICD-10-CM | POA: Insufficient documentation

## 2012-01-04 DIAGNOSIS — R110 Nausea: Secondary | ICD-10-CM | POA: Insufficient documentation

## 2012-01-04 DIAGNOSIS — F17200 Nicotine dependence, unspecified, uncomplicated: Secondary | ICD-10-CM | POA: Insufficient documentation

## 2012-01-04 DIAGNOSIS — Z87442 Personal history of urinary calculi: Secondary | ICD-10-CM | POA: Insufficient documentation

## 2012-01-04 LAB — CBC WITH DIFFERENTIAL/PLATELET
Basophils Absolute: 0 10*3/uL (ref 0.0–0.1)
Basophils Relative: 0 % (ref 0–1)
Eosinophils Absolute: 0.3 10*3/uL (ref 0.0–0.7)
Eosinophils Relative: 3 % (ref 0–5)
Lymphs Abs: 2.6 10*3/uL (ref 0.7–4.0)
MCH: 29.2 pg (ref 26.0–34.0)
Neutrophils Relative %: 66 % (ref 43–77)
Platelets: 160 10*3/uL (ref 150–400)
RBC: 4.96 MIL/uL (ref 3.87–5.11)
RDW: 11.8 % (ref 11.5–15.5)

## 2012-01-04 LAB — BASIC METABOLIC PANEL
Calcium: 9.3 mg/dL (ref 8.4–10.5)
GFR calc Af Amer: >90 mL/min (ref >90)
GFR calc non Af Amer: >90 mL/min (ref >90)
Glucose, Bld: 101 mg/dL — ABNORMAL HIGH (ref 70–99)
Potassium: 3.8 mEq/L (ref 3.5–5.1)
Sodium: 139 mEq/L (ref 135–145)

## 2012-01-04 LAB — URINALYSIS, ROUTINE W REFLEX MICROSCOPIC
Ketones, ur: NEGATIVE mg/dL
Leukocytes, UA: NEGATIVE
Nitrite: NEGATIVE
Specific Gravity, Urine: 1.015 (ref 1.005–1.030)
Urobilinogen, UA: 0.2 mg/dL (ref 0.0–1.0)
pH: 6.5 (ref 5.0–8.0)

## 2012-01-04 MED ORDER — KETOROLAC TROMETHAMINE 60 MG/2ML IM SOLN
60.0000 mg | Freq: Once | INTRAMUSCULAR | Status: AC
  Administered 2012-01-04: 60 mg via INTRAMUSCULAR
  Filled 2012-01-04: qty 2

## 2012-01-04 MED ORDER — MORPHINE SULFATE 4 MG/ML IJ SOLN: 4.0000 mg | Freq: Once | INTRAMUSCULAR | Status: DC

## 2012-01-04 MED ORDER — MORPHINE SULFATE 4 MG/ML IJ SOLN
4.0000 mg | Freq: Once | INTRAMUSCULAR | Status: AC
  Administered 2012-01-04: 4 mg via INTRAMUSCULAR
  Filled 2012-01-04: qty 1

## 2012-01-04 MED ORDER — ONDANSETRON 4 MG PO TBDP
4.0000 mg | ORAL_TABLET | Freq: Once | ORAL | Status: AC
  Administered 2012-01-04: 4 mg via ORAL
  Filled 2012-01-04: qty 1

## 2012-01-04 MED ORDER — HYDROCODONE-ACETAMINOPHEN 5-325 MG PO TABS: 1.0000 | ORAL_TABLET | ORAL | Status: AC | PRN

## 2012-01-04 MED ORDER — ONDANSETRON HCL 4 MG PO TABS: 4.0000 mg | ORAL_TABLET | Freq: Four times a day (QID) | ORAL | Status: AC

## 2012-01-04 NOTE — ED Provider Notes (Signed)
History     CSN: [id number]  Arrival date & time 01/04/12  1209   First MD Initiated Contact with Patient 01/04/12 1301      Chief Complaint  Patient presents with  . Back Pain    (Consider location/radiation/quality/duration/timing/severity/associated sxs/prior treatment) HPI Hx from pt. Carla Singleton is a 31 y.o. female with a hx of nephrolithiasis who presents with flank pain. States she woke up Tues am with L flank pain. She noted an episode of hematuria and thought she passed a stone (she heard a "clink" in the toilet bowl but did not strain her urine). She states that she felt better after that but again had an episode of L flank pain, hematuria, and felt as if she passed another stone this morning. She states that after that she developed R flank pain which was new. She has had nausea but has not vomited since Tues. No changes in bowel movements, dysuria, changes in appetite. She has no hx of abd surgeries.  Past Medical History  Diagnosis Date  . Kidney stones     History reviewed. No pertinent past surgical history.  No family history on file.  History  Substance Use Topics  . Smoking status: Current Everyday Smoker  . Smokeless tobacco: Not on file  . Alcohol Use: Yes     occassionally    OB History    Grav Para Term Preterm Abortions TAB SAB Ect Mult Living                  Review of Systems  Constitutional: Negative for fever and chills.  Gastrointestinal: Positive for nausea and abdominal pain (pain from R flank radiates into abd).  Genitourinary: Positive for hematuria and flank pain. Negative for dysuria, vaginal bleeding and vaginal discharge.  Musculoskeletal: Negative for myalgias.  Skin: Negative for rash.  Neurological: Negative for dizziness and weakness.  All other systems reviewed and are negative.    Allergies  Review of patient's allergies indicates no known allergies.  Home Medications   Current Outpatient Rx  Name Route Sig  Dispense Refill  . ALBUTEROL SULFATE HFA 108 (90 BASE) MCG/ACT IN AERS Inhalation Inhale 1-2 puffs into the lungs every 6 (six) hours as needed for wheezing. 1 Inhaler 0  . HYDROCODONE-ACETAMINOPHEN 10-325 MG PO TABS Oral Take 1 tablet by mouth 2 (two) times daily.     Marland Kitchen HYDROCODONE-ACETAMINOPHEN 5-325 MG PO TABS Oral Take 1 tablet by mouth every 4 (four) hours as needed for pain. 6 tablet 0  . ONDANSETRON HCL 4 MG PO TABS Oral Take 1 tablet (4 mg total) by mouth every 6 (six) hours. 12 tablet 0    BP 123/88  Pulse 74  Temp 98.5 F (36.9 C) (Oral)  Resp 20  SpO2 100%  Physical Exam  Nursing note and vitals reviewed. Constitutional: She appears well-developed and well-nourished. No distress.       Pt uncomfortable appearing, unable to lie still  HENT:  Head: Normocephalic and atraumatic.  Neck: Normal range of motion.  Cardiovascular: Normal rate, regular rhythm and normal heart sounds.   Pulmonary/Chest: Effort normal and breath sounds normal. She exhibits no tenderness.  Abdominal: Soft. Bowel sounds are normal. There is tenderness (mildly ttp in RLQ, no rebound or guard). There is no rebound and no guarding.       R CVA tenderness Negative obturator, Rovsing  Musculoskeletal: Normal range of motion.  Neurological: She is alert.  Skin: Skin is warm and dry. She is  not diaphoretic.  Psychiatric: She has a normal mood and affect.    ED Course  Procedures (including critical care time)  Labs Reviewed  BASIC METABOLIC PANEL - Abnormal; Notable for the following:    Glucose, Bld 101 (*)     All other components within normal limits  URINALYSIS, ROUTINE W REFLEX MICROSCOPIC  PREGNANCY, URINE  CBC WITH DIFFERENTIAL   Ct Abdomen Pelvis Wo Contrast  01/04/2012  *RADIOLOGY REPORT*  Clinical Data: Right flank pain.  History of urinary tract calculi.  CT ABDOMEN AND PELVIS WITHOUT CONTRAST  Technique:  Multidetector CT imaging of the abdomen and pelvis was performed following the  standard protocol without intravenous contrast.  Comparison: Unenhanced CT abdomen and pelvis 05/16/2009, 05/09/2008, 03/16/2005 [place] and 01/20/2009 [facility].  Findings: Lack of intra-abdominal fat makes the examination more difficult to interpret.  Numerous tiny bilateral renal calculi as noted on prior examinations.  No ureteral calculi on either side. No evidence of hydronephrosis involving either kidney.  Within the limits of the unenhanced technique, no focal parenchymal abnormality involving either kidney.  Symmetric Hounsfield measurements in both kidneys, suggesting against unilateral renal edema.  Normal low dose unenhanced appearance of the liver, spleen, pancreas, and adrenal glands.  Gallbladder unremarkable by CT.  No biliary ductal dilation.  No visible atherosclerosis.  No significant lymphadenopathy.  Stomach decompressed and normal by CT.  Normal-appearing small bowel.  Moderate stool burden throughout normal colon.  Cecum extends low into the right side of the pelvis; while the appendix is not visualized, no pericecal inflammation.  Retroflexed uterus containing an IUD which appears appropriately positioned in the fundus.  No adnexal masses.  Small amount of free fluid in the cul-de-sac.  Urinary bladder unremarkable. Phleboliths low in the left side of the pelvis as noted previously.  Visualized lung bases clear.  Bone window images unremarkable. Heart size normal.  IMPRESSION:  1.  Bilateral nephrolithiasis.  No evidence of obstructing ureteral calculi on either side. 2.  Small amount of physiologic free fluid in the pelvis. 3.  No acute abnormalities involving the abdomen or pelvis.   Original Report Authenticated By: Arnell Sieving, M.D.      1. Flank pain       MDM  Pt with intermittent flank pain x 4 days. She states that pain was originally in the LEFT flank, but seemed to go to the RIGHT flank today. On exam, pt has ttp to the R CVA area and appears  uncomfortable. She has slight tenderness in RLQ without peritoneal signs. No hematuria or UTI by urine sample. Imaging study is negative for nephrolithiasis, hydronephrosis/stranding/obstruction. Pt will be given small rx for Norco, instructed to f/u with worsening or changing abdominal pain.        Grant Fontana, PA-C 01/04/12 1535

## 2012-01-04 NOTE — ED Notes (Signed)
Back pain x 3 days. Hematuria. Hx of kidney stones.

## 2012-01-05 NOTE — ED Provider Notes (Signed)
Medical screening examination/treatment/procedure(s) were performed by non-physician practitioner and as supervising physician I was immediately available for consultation/collaboration.  Karelly Dewalt, MD 01/05/12 0706 

## 2013-04-15 ENCOUNTER — Other Ambulatory Visit: Payer: Self-pay | Admitting: Internal Medicine

## 2013-04-15 DIAGNOSIS — C220 Liver cell carcinoma: Secondary | ICD-10-CM

## 2013-04-17 ENCOUNTER — Encounter (HOSPITAL_COMMUNITY): Payer: Self-pay | Admitting: *Deleted

## 2013-04-17 ENCOUNTER — Inpatient Hospital Stay (HOSPITAL_COMMUNITY): Payer: BC Managed Care – PPO

## 2013-04-17 ENCOUNTER — Ambulatory Visit: Payer: Self-pay

## 2013-04-17 ENCOUNTER — Encounter (HOSPITAL_COMMUNITY): Payer: BC Managed Care – PPO | Admitting: Anesthesiology

## 2013-04-17 ENCOUNTER — Encounter (HOSPITAL_COMMUNITY)
Admission: AD | Disposition: A | Discharge status: Home / Self Care | Payer: Self-pay | Source: Ambulatory Visit | Attending: Obstetrics & Gynecology

## 2013-04-17 ENCOUNTER — Inpatient Hospital Stay (HOSPITAL_COMMUNITY): Admission: RE | Admit: 2013-04-17 | Payer: Self-pay | Source: Ambulatory Visit

## 2013-04-17 ENCOUNTER — Ambulatory Visit (INDEPENDENT_AMBULATORY_CARE_PROVIDER_SITE_OTHER): Payer: BC Managed Care – PPO | Admitting: Internal Medicine

## 2013-04-17 ENCOUNTER — Inpatient Hospital Stay (HOSPITAL_COMMUNITY): Payer: BC Managed Care – PPO | Admitting: Anesthesiology

## 2013-04-17 ENCOUNTER — Ambulatory Visit (HOSPITAL_COMMUNITY)
Admission: AD | Admit: 2013-04-17 | Discharge: 2013-04-18 | Disposition: A | Discharge status: Home / Self Care | Payer: BC Managed Care – PPO | Source: Ambulatory Visit | Attending: Obstetrics & Gynecology | Admitting: Obstetrics & Gynecology

## 2013-04-17 VITALS — BP 92/58 | HR 85 | Temp 98.0°F | Resp 18 | Wt 133.0 lb

## 2013-04-17 DIAGNOSIS — N949 Unspecified condition associated with female genital organs and menstrual cycle: Secondary | ICD-10-CM

## 2013-04-17 DIAGNOSIS — R82998 Other abnormal findings in urine: Secondary | ICD-10-CM

## 2013-04-17 DIAGNOSIS — R8281 Pyuria: Secondary | ICD-10-CM

## 2013-04-17 DIAGNOSIS — Z3201 Encounter for pregnancy test, result positive: Secondary | ICD-10-CM

## 2013-04-17 DIAGNOSIS — O009 Unspecified ectopic pregnancy without intrauterine pregnancy: Secondary | ICD-10-CM

## 2013-04-17 DIAGNOSIS — R109 Unspecified abdominal pain: Secondary | ICD-10-CM

## 2013-04-17 DIAGNOSIS — O00109 Unspecified tubal pregnancy without intrauterine pregnancy: Secondary | ICD-10-CM | POA: Insufficient documentation

## 2013-04-17 DIAGNOSIS — R102 Pelvic and perineal pain: Secondary | ICD-10-CM

## 2013-04-17 HISTORY — PX: LAPAROSCOPY: SHX197

## 2013-04-17 LAB — POCT URINALYSIS DIPSTICK
Bilirubin, UA: NEGATIVE
Glucose, UA: NEGATIVE
Ketones, UA: NEGATIVE
Leukocytes, UA: NEGATIVE
Protein, UA: NEGATIVE
Spec Grav, UA: >=1.03

## 2013-04-17 LAB — POCT CBC
Granulocyte percent: 65.7 % (ref 37–80)
HCT, POC: 43.6 % (ref 37.7–47.9)
Hemoglobin: 13.6 g/dL (ref 12.2–16.2)
Lymph, poc: 2.6 (ref 0.6–3.4)
MCH, POC: 28.8 pg (ref 27–31.2)
MCHC: 31.2 g/dL — AB (ref 31.8–35.4)
MCV: 92.4 fL (ref 80–97)
MID (cbc): 0.6 (ref 0–0.9)
MPV: 10 fL (ref 0–99.8)
POC Granulocyte: 6.1 (ref 2–6.9)
POC LYMPH PERCENT: 28.3 % (ref 10–50)
POC MID %: 6 % (ref 0–12)
Platelet Count, POC: 192 K/uL (ref 142–424)
RBC: 4.72 M/uL (ref 4.04–5.48)
RDW, POC: 11.9 %
WBC: 9.3 K/uL (ref 4.6–10.2)

## 2013-04-17 LAB — HCG, QUANTITATIVE, PREGNANCY: hCG, Beta Chain, Quant, S: 9094 m[IU]/mL — ABNORMAL HIGH (ref <5)

## 2013-04-17 LAB — POCT UA - MICROSCOPIC ONLY
Casts, Ur, LPF, POC: NEGATIVE
Mucus, UA: POSITIVE
Yeast, UA: NEGATIVE

## 2013-04-17 LAB — ABO/RH: ABO/RH(D): B POS

## 2013-04-17 SURGERY — LAPAROSCOPY OPERATIVE
Anesthesia: General | Site: Abdomen

## 2013-04-17 MED ORDER — ONDANSETRON HCL 4 MG/2ML IJ SOLN
INTRAMUSCULAR | Status: AC
  Filled 2013-04-17: qty 2

## 2013-04-17 MED ORDER — ROCURONIUM BROMIDE 100 MG/10ML IV SOLN
INTRAVENOUS | Status: AC
  Filled 2013-04-17: qty 1

## 2013-04-17 MED ORDER — NEOSTIGMINE METHYLSULFATE 1 MG/ML IJ SOLN
INTRAMUSCULAR | Status: DC | PRN
  Administered 2013-04-17: 5 mg via INTRAVENOUS

## 2013-04-17 MED ORDER — EPHEDRINE SULFATE 50 MG/ML IJ SOLN
INTRAMUSCULAR | Status: DC | PRN
  Administered 2013-04-17: 10 mg via INTRAVENOUS

## 2013-04-17 MED ORDER — FENTANYL CITRATE 0.05 MG/ML IJ SOLN
INTRAMUSCULAR | Status: DC | PRN
  Administered 2013-04-17: 100 ug via INTRAVENOUS
  Administered 2013-04-17 (×3): 50 ug via INTRAVENOUS

## 2013-04-17 MED ORDER — KETOROLAC TROMETHAMINE 30 MG/ML IJ SOLN
INTRAMUSCULAR | Status: AC
  Filled 2013-04-17: qty 1

## 2013-04-17 MED ORDER — LIDOCAINE HCL (CARDIAC) 20 MG/ML IV SOLN
INTRAVENOUS | Status: AC
  Filled 2013-04-17: qty 5

## 2013-04-17 MED ORDER — FENTANYL CITRATE 0.05 MG/ML IJ SOLN
25.0000 ug | INTRAMUSCULAR | Status: DC | PRN
  Administered 2013-04-17 (×2): 50 ug via INTRAVENOUS

## 2013-04-17 MED ORDER — EPHEDRINE 5 MG/ML INJ
INTRAVENOUS | Status: AC
Start: 2013-04-17 — End: 2013-04-17
  Filled 2013-04-17: qty 10

## 2013-04-17 MED ORDER — GLYCOPYRROLATE 0.2 MG/ML IJ SOLN
INTRAMUSCULAR | Status: AC
  Filled 2013-04-17: qty 1

## 2013-04-17 MED ORDER — DEXAMETHASONE SODIUM PHOSPHATE 10 MG/ML IJ SOLN
INTRAMUSCULAR | Status: DC | PRN
  Administered 2013-04-17: 8 mg via INTRAVENOUS

## 2013-04-17 MED ORDER — CITRIC ACID-SODIUM CITRATE 334-500 MG/5ML PO SOLN
30.0000 mL | Freq: Once | ORAL | Status: AC
  Administered 2013-04-17: 30 mL via ORAL
  Filled 2013-04-17: qty 15

## 2013-04-17 MED ORDER — OXYCODONE-ACETAMINOPHEN 5-325 MG PO TABS
ORAL_TABLET | ORAL | Status: AC
  Filled 2013-04-17: qty 1

## 2013-04-17 MED ORDER — KETOROLAC TROMETHAMINE 30 MG/ML IJ SOLN
15.0000 mg | Freq: Once | INTRAMUSCULAR | Status: AC | PRN
  Administered 2013-04-17: 30 mg via INTRAVENOUS

## 2013-04-17 MED ORDER — MEPERIDINE HCL 25 MG/ML IJ SOLN: 6.2500 mg | INTRAMUSCULAR | Status: DC | PRN

## 2013-04-17 MED ORDER — MIDAZOLAM HCL 2 MG/2ML IJ SOLN
INTRAMUSCULAR | Status: DC | PRN
  Administered 2013-04-17: 2 mg via INTRAVENOUS

## 2013-04-17 MED ORDER — IBUPROFEN 800 MG PO TABS: 800.0000 mg | ORAL_TABLET | Freq: Three times a day (TID) | ORAL | Status: AC | PRN

## 2013-04-17 MED ORDER — DEXAMETHASONE SODIUM PHOSPHATE 10 MG/ML IJ SOLN
INTRAMUSCULAR | Status: AC
  Filled 2013-04-17: qty 1

## 2013-04-17 MED ORDER — MIDAZOLAM HCL 2 MG/2ML IJ SOLN
INTRAMUSCULAR | Status: AC
  Filled 2013-04-17: qty 2

## 2013-04-17 MED ORDER — LACTATED RINGERS IV SOLN
INTRAVENOUS | Status: DC
  Administered 2013-04-17 (×2): via INTRAVENOUS

## 2013-04-17 MED ORDER — LACTATED RINGERS IR SOLN
Status: DC | PRN
  Administered 2013-04-17: 3000 mL

## 2013-04-17 MED ORDER — LIDOCAINE HCL (CARDIAC) 20 MG/ML IV SOLN
INTRAVENOUS | Status: DC | PRN
  Administered 2013-04-17: 30 mg via INTRAVENOUS

## 2013-04-17 MED ORDER — SUCCINYLCHOLINE CHLORIDE 20 MG/ML IJ SOLN
INTRAMUSCULAR | Status: AC
  Filled 2013-04-17: qty 10

## 2013-04-17 MED ORDER — FENTANYL CITRATE 0.05 MG/ML IJ SOLN
INTRAMUSCULAR | Status: AC
  Filled 2013-04-17: qty 2

## 2013-04-17 MED ORDER — MIDAZOLAM HCL 2 MG/2ML IJ SOLN
0.5000 mg | Freq: Once | INTRAMUSCULAR | Status: AC | PRN
  Administered 2013-04-17: 1 mg via INTRAVENOUS

## 2013-04-17 MED ORDER — OXYCODONE-ACETAMINOPHEN 5-325 MG PO TABS: 1.0000 | ORAL_TABLET | ORAL | Status: DC | PRN

## 2013-04-17 MED ORDER — HYDROMORPHONE HCL PF 1 MG/ML IJ SOLN
1.0000 mg | Freq: Once | INTRAMUSCULAR | Status: AC
  Administered 2013-04-17: 1 mg via INTRAMUSCULAR
  Filled 2013-04-17: qty 1

## 2013-04-17 MED ORDER — GLYCOPYRROLATE 0.2 MG/ML IJ SOLN
INTRAMUSCULAR | Status: AC
  Filled 2013-04-17: qty 3

## 2013-04-17 MED ORDER — BUPIVACAINE HCL (PF) 0.5 % IJ SOLN
INTRAMUSCULAR | Status: DC | PRN
  Administered 2013-04-17: 8.5 mL

## 2013-04-17 MED ORDER — SUCCINYLCHOLINE CHLORIDE 20 MG/ML IJ SOLN
INTRAMUSCULAR | Status: DC | PRN
  Administered 2013-04-17: 120 mg via INTRAVENOUS

## 2013-04-17 MED ORDER — FENTANYL CITRATE 0.05 MG/ML IJ SOLN
INTRAMUSCULAR | Status: AC
  Filled 2013-04-17: qty 5

## 2013-04-17 MED ORDER — PROPOFOL 10 MG/ML IV BOLUS
INTRAVENOUS | Status: DC | PRN
  Administered 2013-04-17: 200 mg via INTRAVENOUS

## 2013-04-17 MED ORDER — ONDANSETRON HCL 4 MG/2ML IJ SOLN
INTRAMUSCULAR | Status: DC | PRN
  Administered 2013-04-17: 4 mg via INTRAVENOUS

## 2013-04-17 MED ORDER — ROCURONIUM BROMIDE 100 MG/10ML IV SOLN
INTRAVENOUS | Status: DC | PRN
  Administered 2013-04-17: 20 mg via INTRAVENOUS

## 2013-04-17 MED ORDER — GLYCOPYRROLATE 0.2 MG/ML IJ SOLN
INTRAMUSCULAR | Status: DC | PRN
  Administered 2013-04-17: .6 mg via INTRAVENOUS
  Administered 2013-04-17: 0.2 mg via INTRAVENOUS

## 2013-04-17 MED ORDER — OXYCODONE-ACETAMINOPHEN 5-325 MG PO TABS
1.0000 | ORAL_TABLET | ORAL | Status: DC | PRN
  Administered 2013-04-17: 1 via ORAL

## 2013-04-17 MED ORDER — PROMETHAZINE HCL 25 MG/ML IJ SOLN: 6.2500 mg | INTRAMUSCULAR | Status: DC | PRN

## 2013-04-17 MED ORDER — PROPOFOL 10 MG/ML IV EMUL
INTRAVENOUS | Status: AC
  Filled 2013-04-17: qty 20

## 2013-04-17 MED ORDER — NEOSTIGMINE METHYLSULFATE 1 MG/ML IJ SOLN
INTRAMUSCULAR | Status: AC
  Filled 2013-04-17: qty 1

## 2013-04-17 MED ORDER — FAMOTIDINE IN NACL 20-0.9 MG/50ML-% IV SOLN
20.0000 mg | Freq: Once | INTRAVENOUS | Status: AC
  Administered 2013-04-17: 20 mg via INTRAVENOUS
  Filled 2013-04-17: qty 50

## 2013-04-17 SURGICAL SUPPLY — 31 items
APPLICATOR COTTON TIP 6IN STRL (MISCELLANEOUS) ×2 IMPLANT
BAG SPEC RTRVL LRG 6X4 10 (ENDOMECHANICALS)
CABLE HIGH FREQUENCY MONO STRZ (ELECTRODE) IMPLANT
CATH ROBINSON RED A/P 16FR (CATHETERS) ×2 IMPLANT
CLOTH BEACON ORANGE TIMEOUT ST (SAFETY) ×2 IMPLANT
DURAPREP 26ML APPLICATOR (WOUND CARE) ×2 IMPLANT
ELECT REM PT RETURN 9FT ADLT (ELECTROSURGICAL) ×2
ELECTRODE REM PT RTRN 9FT ADLT (ELECTROSURGICAL) IMPLANT
FORCEPS CUTTING 33CM 5MM (CUTTING FORCEPS) IMPLANT
FORCEPS CUTTING 45CM 5MM (CUTTING FORCEPS) IMPLANT
GLOVE BIO SURGEON STRL SZ 6.5 (GLOVE) ×2 IMPLANT
GOWN PREVENTION PLUS LG XLONG (DISPOSABLE) ×4 IMPLANT
NDL SAFETY ECLIPSE 18X1.5 (NEEDLE) ×1 IMPLANT
NEEDLE HYPO 18GX1.5 SHARP (NEEDLE) ×2
NEEDLE INSUFFLATION 120MM (ENDOMECHANICALS) ×2 IMPLANT
NS IRRIG 1000ML POUR BTL (IV SOLUTION) ×1 IMPLANT
PACK LAPAROSCOPY BASIN (CUSTOM PROCEDURE TRAY) ×2 IMPLANT
POUCH SPECIMEN RETRIEVAL 10MM (ENDOMECHANICALS) IMPLANT
PROTECTOR NERVE ULNAR (MISCELLANEOUS) ×2 IMPLANT
SCALPEL HARMONIC ACE (MISCELLANEOUS) ×1 IMPLANT
SET IRRIG TUBING LAPAROSCOPIC (IRRIGATION / IRRIGATOR) ×1 IMPLANT
STRIP CLOSURE SKIN 1/2X4 (GAUZE/BANDAGES/DRESSINGS) ×1 IMPLANT
SUT VICRYL 0 ENDOLOOP (SUTURE) IMPLANT
SUT VICRYL 0 UR6 27IN ABS (SUTURE) ×2 IMPLANT
SUT VICRYL 4-0 PS2 18IN ABS (SUTURE) ×3 IMPLANT
TOWEL OR 17X24 6PK STRL BLUE (TOWEL DISPOSABLE) ×4 IMPLANT
TROCAR OPTI TIP 5M 100M (ENDOMECHANICALS) ×3 IMPLANT
TROCAR XCEL DIL TIP R 11M (ENDOMECHANICALS) ×2 IMPLANT
TROCAR XCEL OPT SLVE 5M 100M (ENDOMECHANICALS) ×1 IMPLANT
WARMER LAPAROSCOPE (MISCELLANEOUS) ×2 IMPLANT
WATER STERILE IRR 1000ML POUR (IV SOLUTION) ×1 IMPLANT

## 2013-04-17 NOTE — Transfer of Care (Signed)
Immediate Anesthesia Transfer of Care Note  Patient: Carla Singleton  Procedure(s) Performed: Procedure(s): Operative Laparoscopy  Left Salpingectomy (N/A)  Patient Location: PACU  Anesthesia Type:General  Level of Consciousness: awake, alert  and oriented  Airway & Oxygen Therapy: Patient Spontanous Breathing and Patient connected to nasal cannula oxygen  Post-op Assessment: Report given to PACU RN and Post -op Vital signs reviewed and stable  Post vital signs: Reviewed and stable  Complications: No apparent anesthesia complications

## 2013-04-17 NOTE — Anesthesia Postprocedure Evaluation (Signed)
  Anesthesia Post Note  Patient: Carla Singleton  Procedure(s) Performed: Procedure(s) (LRB): Operative Laparoscopy  Left Salpingectomy (N/A)  Anesthesia type: GA  Patient location: PACU  Post pain: Pain level controlled  Post assessment: Post-op Vital signs reviewed  Last Vitals:  Filed Vitals:   04/17/13 1911  BP: 109/72  Pulse: 60  Temp: 37.1 C  Resp: 20    Post vital signs: Reviewed  Level of consciousness: sedated  Complications: No apparent anesthesia complications

## 2013-04-17 NOTE — Op Note (Signed)
04/17/2013  10:26 PM  PATIENT:  Carla Singleton  32 y.o. female  PRE-OPERATIVE DIAGNOSIS:  Ruptured left ectopic pregnancy  POST-OPERATIVE DIAGNOSIS: same  PROCEDURE:  Procedure(s): LAPAROSCOPY OPERATIVE (N/A) LEFT SALPINGECTOMY    SURGEON:  Surgeon(s) and Role:    * Allie Bossier, MD - Primary  PHYSICIAN ASSISTANT:   ASSISTANTS: none   ANESTHESIA:   general  EBL:  Total I/O In: -  Out: 400 [Other:400]  BLOOD ADMINISTERED:none  DRAINS: none   LOCAL MEDICATIONS USED:  MARCAINE     SPECIMEN:  Source of Specimen:  left oviduct  DISPOSITION OF SPECIMEN:  PATHOLOGY  COUNTS:  YES  TOURNIQUET:  * No tourniquets in log *  DICTATION: .Dragon Dictation  PLAN OF CARE: Discharge to home after PACU  PATIENT DISPOSITION:  PACU - hemodynamically stable.   Delay start of Pharmacological VTE agent (>24hrs) due to surgical blood loss or risk of bleeding: not applicable  The risks, benefits, and alternatives of surgery were explained, understood, accepted.uncancer. In the operating room she was placed in the dorsal lithotomy position, and general anesthesia was given without complication. Her abdomen and vagina were prepped and draped in the usual sterile fashion. A timeout procedure was done. A bimanual exam revealed a small anteverted and mobile uterus. Her adnexa felt normal. A Hulka manipulator was placedt. Her bladder was emptied with a Robinson catheter. Gloves were changed, and attention was turned to the abdomen. Approximately the 5 mL of 0.5% Marcaine was injected into the umbilicus. A vertical incision was made at the site. A varies needle was placed intraperitoneally. Low-flow CO2 was used to insufflate the abdomen to approximately 3-1/2 L. Once a good pneumoperitoneum was established, a 10 mm Excel trocar was placed. Laparoscopy confirmed correct placement.a 5 mm port was placed in each lower quadrant under direct laparoscopic visualization after injecting 0.5% Marcaine in  the incision sites.There was a large amount of blood in her pelvis. I suctioned the blood out of the pelvis. The right adnexa appeared normal. The left showed bleeding from the fimbria and a large bulge was noted in the distal portion of the tube. A Harmonic scapel was used to hemostatically remove the left oviduct. I switched to a 5 mm camera and removed the oviduct through the umbilical port.  I removed the 5 mm ports and noted hemostasis. The umbilical fascia was closed with a 0 vicryl suture. No defects were palpable. A subcuticular closure was done with 4-0 Vicryl suture at all incision sites. A Steri-Strip was placed across each incision. She was extubated and taken to the recovery room in stable condition.

## 2013-04-17 NOTE — Progress Notes (Addendum)
Subjective:  This chart was scribed for Carla Sia, MD by Andrew Au, ED Scribe. This patient was seen in room Room 9 and the patient's care was started at 4:34 PM .   Patient ID: Carla Singleton, female    DOB: 1980/11/30, 32 y.o.   MRN: [id number]  HPI This chart was scribed for Carla Sia, MD by Andrew Au, Scribe. This patient was seen in room 9 and the patient's care was started at 4:34 PM.  HPI Comments: Carla Singleton is a 32 y.o. female who presents to the [facility] complaining of intermittent, gradually worsening lower abdominal pain. Pt states that she has been experiencing heavy bleeding after intercourse. She states that she woke up this morning with a "stabbing" pain in cervix which made her feel sweaty and flushed.  Pt states that she has an IUD/ 4.5 yrs mirena/ andwonders if in bad place-can't find string. She states that the pain worsens when coughing and with certain movements. She reports that she has been married for 8 years and states that she nor her husband has had intercourse outside of their relationship. Pt denies an irregular menstrual cycle, painful intercourse, dysuria, increased urination or vaginal discharge. She also denies fever, emesis, diarrhea.    She reports Dr.Bovard is her OBG/YN 2 kids usu healthy     Past Medical History  Diagnosis Date   Kidney stones    History reviewed. No pertinent past surgical history. Family History  Problem Relation Age of Onset   Stroke Maternal Grandmother    Cancer Maternal Grandfather    History   Social History   Marital Status: Married    Spouse Name: N/A    Number of Children: N/A   Years of Education: N/A   Occupational History   Not on file.   Social History Main Topics   Smoking status: Current Every Day Smoker   Smokeless tobacco: Not on file   Alcohol Use: Yes     Comment: occassionally   Drug Use: No   Sexual Activity: Not on file   Other  Topics Concern   Not on file   Social History Narrative   No narrative on file   No Known Allergies There are no active problems to display for this patient.  No orders of the defined types were placed in this encounter.    Review of Systems  Constitutional: Negative for chills and fatigue.       Recent breast tenderness  Gastrointestinal: Positive for abdominal pain. Negative for nausea, vomiting, diarrhea and constipation.  Genitourinary: Positive for vaginal bleeding and pelvic pain. Negative for dysuria, frequency, vaginal discharge and menstrual problem.       Objective:   Physical Exam  Nursing note and vitals reviewed. Constitutional: She is oriented to person, place, and time. She appears well-developed and well-nourished. No distress.  HENT:  Head: Normocephalic and atraumatic.  Eyes: Conjunctivae are normal. Right eye exhibits no discharge. Left eye exhibits no discharge.  Neck: Normal range of motion.  Cardiovascular: Normal rate.   Pulmonary/Chest: Effort normal. No respiratory distress.  Abdominal: Soft. She exhibits no distension and no mass. There is tenderness. There is rebound.  Tender all lower quads w/ pos perc tenderness  Genitourinary: Vagina normal. No vaginal discharge found.  No IUD seen Tender to cerv manip/no discharge Ut midpos tender moderately Both adnexae tender without mass  Musculoskeletal: Normal range of motion.  Neurological: She is alert and oriented to person, place,  and time.  Skin: Skin is warm and dry.  Psychiatric: She has a normal mood and affect. Her behavior is normal.   TriageVitals BP 92/58   Pulse 85   Temp(Src) 98 F (36.7 C) (Oral)   Resp 18   Wt 133 lb (60.328 kg)   SpO2 98%  DIAGNOSTIC STUDIES: Oxygen Saturation is 98% on RA, normal by my interpretation.       Results for orders placed in visit on 04/17/13  POCT UA - MICROSCOPIC ONLY      Result Value Range   WBC, Ur, HPF, POC 9-23  CULTURED!   RBC, urine,  microscopic 1-3     Bacteria, U Microscopic 3+     Mucus, UA pos     Epithelial cells, urine per micros 2-4     Crystals, Ur, HPF, POC calcium oxalate  Hx stones   Casts, Ur, LPF, POC neg     Yeast, UA neg    POCT URINALYSIS DIPSTICK      Result Value Range   Color, UA yellow     Clarity, UA clear     Glucose, UA neg     Bilirubin, UA neg     Ketones, UA neg     Spec Grav, UA >=1.030     Blood, UA neg     pH, UA 6.0     Protein, UA neg     Urobilinogen, UA 1.0     Nitrite, UA neg     Leukocytes, UA Negative    POCT CBC      Result Value Range   WBC 9.3  4.6 - 10.2 K/uL   Lymph, poc 2.6  0.6 - 3.4   POC LYMPH PERCENT 28.3  10 - 50 %L   MID (cbc) 0.6  0 - 0.9   POC MID % 6.0  0 - 12 %M   POC Granulocyte 6.1  2 - 6.9   Granulocyte percent 65.7  37 - 80 %G   RBC 4.72  4.04 - 5.48 M/uL   Hemoglobin 13.6  12.2 - 16.2 g/dL   HCT, POC 16.1  09.6 - 47.9 %   MCV 92.4  80 - 97 fL   MCH, POC 28.8  27 - 31.2 pg   MCHC 31.2 (*) 31.8 - 35.4 g/dL   RDW, POC 04.5     Platelet Count, POC 192  142 - 424 K/uL   MPV 10.0  0 - 99.8 fL  POCT URINE PREGNANCY      Result Value Range   Preg Test, Ur Positive    gen probe sent  Assessment & Plan:  I have completed the patient encounter in its entirety as documented by the scribe, with editing by me where necessary. Carla Singleton, M.D.  Pelvic pain Pregnant Retained IUD  To [facility] for eval/us etc=ectopic preg

## 2013-04-17 NOTE — Anesthesia Preprocedure Evaluation (Signed)
Anesthesia Evaluation  Patient identified by MRN, date of birth, ID band Patient awake    Reviewed: Allergy & Precautions, H&P , Patient's Chart, lab work & pertinent test results, reviewed documented beta blocker date and time   History of Anesthesia Complications Negative for: history of anesthetic complications  Airway Mallampati: II TM Distance: >3 FB Neck ROM: full    Dental   Pulmonary Current Smoker,  breath sounds clear to auscultation        Cardiovascular Exercise Tolerance: Good Rhythm:regular Rate:Normal     Neuro/Psych    GI/Hepatic   Endo/Other    Renal/GU      Musculoskeletal   Abdominal   Peds  Hematology   Anesthesia Other Findings   Reproductive/Obstetrics                           Anesthesia Physical Anesthesia Plan  ASA: II and emergent  Anesthesia Plan: General ETT   Post-op Pain Management:    Induction: Rapid sequence, Cricoid pressure planned and Intravenous  Airway Management Planned: Oral ETT  Additional Equipment:   Intra-op Plan:   Post-operative Plan:   Informed Consent: I have reviewed the patients History and Physical, chart, labs and discussed the procedure including the risks, benefits and alternatives for the proposed anesthesia with the patient or authorized representative who has indicated his/her understanding and acceptance.   Dental Advisory Given  Plan Discussed with: CRNA and Surgeon  Anesthesia Plan Comments:         Anesthesia Quick Evaluation  

## 2013-04-17 NOTE — MAU Provider Note (Signed)
Chief Complaint: No chief complaint on file.   First Provider Initiated Contact with Patient 04/17/13 1925     SUBJECTIVE HPI: Carla Singleton is a 32 y.o. G3P2002 at unknown GA referred here from [facility] where she was evaluated today for severe lower abdominal pains. Pain first occurred shortly after awakening today. She desribes it as sharp, stabbing and radiating to vagina and low back. Has chronic LBP and tried one Norco this am with little relief.  Nausea but no vomiting and is hungry now. Unaware of pregnancy until UPT done today.  Denies hematuria, dysuria, increased urination or vaginal discharge. Has Mirena in place; amenorrheic on IUD. Has had postcoital bleeding, last episode 2 weeks ago.   Past Medical History  Diagnosis Date  . Kidney stones    OB History  Gravida Para Term Preterm AB SAB TAB Ectopic Multiple Living  2 2 2       2     # Outcome Date GA Lbr Len/2nd Weight Sex Delivery Anes PTL Lv  2 TRM 08/11/07 [redacted]w[redacted]d   F SVD   Y  1 TRM 04/29/05 [redacted]w[redacted]d   M SVD  N Y     History reviewed. No pertinent past surgical history. History   Social History  . Marital Status: Married    Spouse Name: N/A    Number of Children: N/A  . Years of Education: N/A   Occupational History  . Not on file.   Social History Main Topics  . Smoking status: Current Every Day Smoker  . Smokeless tobacco: Not on file  . Alcohol Use: Yes     Comment: occassionally  . Drug Use: No  . Sexual Activity: Not on file   Other Topics Concern  . Not on file   Social History Narrative  . No narrative on file   No current facility-administered medications on file prior to encounter.   Current Outpatient Prescriptions on File Prior to Encounter  Medication Sig Dispense Refill  . HYDROcodone-acetaminophen (NORCO) 10-325 MG per tablet Take 1 tablet by mouth 2 (two) times daily.        No Known Allergies  ROS: Pertinent items in HPI  OBJECTIVE Blood pressure 109/72,  pulse 60, temperature 98.7 F (37.1 C), resp. rate 20. GENERAL: Well-developed, well-nourished female in apparent acute distress, moaning, wincing and holding her abdomen periodically during interview and exam.  HEENT: Normocephalic HEART: normal rate RESP: normal effort ABDOMEN: Soft, flat. Lower abdomen tender throughout with guarding and apparent rebound. Localized most to mid lower LLQ tender EXTREMITIES: Nontender, no edema NEURO: Alert and oriented LAB RESULTS Results for orders placed in visit on 04/17/13 (from the past 24 hour(s))  POCT URINALYSIS DIPSTICK     Status: None   Collection Time    04/17/13  4:49 PM      Result Value Range   Color, UA yellow     Clarity, UA clear     Glucose, UA neg     Bilirubin, UA neg     Ketones, UA neg     Spec Grav, UA >=1.030     Blood, UA neg     pH, UA 6.0     Protein, UA neg     Urobilinogen, UA 1.0     Nitrite, UA neg     Leukocytes, UA Negative    POCT UA - MICROSCOPIC ONLY     Status: None   Collection Time    04/17/13  5:01 PM  Result Value Range   WBC, Ur, HPF, POC 9-23     RBC, urine, microscopic 1-3     Bacteria, U Microscopic 3+     Mucus, UA pos     Epithelial cells, urine per micros 2-4     Crystals, Ur, HPF, POC calcium oxalate     Casts, Ur, LPF, POC neg     Yeast, UA neg    POCT CBC     Status: Abnormal   Collection Time    04/17/13  5:01 PM      Result Value Range   WBC 9.3  4.6 - 10.2 K/uL   Lymph, poc 2.6  0.6 - 3.4   POC LYMPH PERCENT 28.3  10 - 50 %L   MID (cbc) 0.6  0 - 0.9   POC MID % 6.0  0 - 12 %M   POC Granulocyte 6.1  2 - 6.9   Granulocyte percent 65.7  37 - 80 %G   RBC 4.72  4.04 - 5.48 M/uL   Hemoglobin 13.6  12.2 - 16.2 g/dL   HCT, POC 16.1  09.6 - 47.9 %   MCV 92.4  80 - 97 fL   MCH, POC 28.8  27 - 31.2 pg   MCHC 31.2 (*) 31.8 - 35.4 g/dL   RDW, POC 04.5     Platelet Count, POC 192  142 - 424 K/uL   MPV 10.0  0 - 99.8 fL  POCT URINE PREGNANCY     Status: None   Collection Time     04/17/13  5:32 PM      Result Value Range   Preg Test, Ur Positive     MAU Course Dilaudid 1 mg IM Labs, [place] ordered Care assumed by Wynelle Bourgeois, CNM at [address] CNM 04/17/2013 7:57 PM  Addendum:  US showed a 9cm Left adnexal mass with simple free fluid in pelvis.  IUD is in place in the endometrium  Last ate at 1530 hrs.   Dr Marice Potter in to consent for Surgery. US Ob Transvaginal  04/17/2013   CLINICAL DATA:  Pregnant, IUD  EXAM: OBSTETRIC <14 WK [place] AND TRANSVAGINAL OB US  TECHNIQUE: Both transabdominal and transvaginal ultrasound examinations were performed for complete evaluation of the gestation as well as the maternal uterus, adnexal regions, and pelvic cul-de-sac. Transvaginal technique was performed to assess early pregnancy.  COMPARISON:  None.  FINDINGS: Intrauterine gestational sac: Not visualized  Maternal uterus/adnexae: IUD in satisfactory position within the endometrium. Endometrial thickness is difficult to discretely measure.  Right ovary is within normal limits, measuring 3.0 x 2.2 x 1.8 cm.  Left ovary is within normal limits, measuring 1.7 x 3.8 x 2.4 cm.  Adjacent 9.2 x 5.6 x 5.4 cm complex left adnexal mass, distinct from the left ovary, highly suspicious for ectopic pregnancy.  Large volume pelvic ascites, predominantly simple.    IMPRESSION: No IUP is visualized.  9.2 cm complex left adnexal mass, highly suspicious for ectopic pregnancy.  IUD in satisfactory position.    Critical Value/emergent results were called by telephone at the time of interpretation on 04/17/2013 at 9:00 PM to Wynelle Bourgeois, who verbally acknowledged these results.   Electronically Signed   By: Charline Bills M.D.   On: 04/17/2013 21:03   Aviva Signs, CNM

## 2013-04-17 NOTE — Anesthesia Procedure Notes (Signed)
Procedure Name: Intubation Date/Time: 04/17/2013 9:50 PM Performed by: Allie Bossier Pre-anesthesia Checklist: Patient identified, Emergency Drugs available, Suction available and Patient being monitored Patient Re-evaluated:Patient Re-evaluated prior to inductionOxygen Delivery Method: Circle system utilized Preoxygenation: Pre-oxygenation with 100% oxygen Intubation Type: IV induction, Rapid sequence and Cricoid Pressure applied Laryngoscope Size: Miller and 2 Grade View: Grade I Tube size: 7.0 mm Number of attempts: 1 Airway Equipment and Method: Stylet Placement Confirmation: ETT inserted through vocal cords under direct vision,  positive ETCO2 and breath sounds checked- equal and bilateral Secured at: 22 cm Tube secured with: Tape Dental Injury: Teeth and Oropharynx as per pre-operative assessment

## 2013-04-17 NOTE — MAU Note (Signed)
Patient complains of of severe lower abdominal pain and vomiting x1 this morning. Pain worsens with moving and coughing. Has been have post coital bleeding. IUD for the last 4 years but unable to find the string. Was seen at urgent care and was told she had a positive pregnancy test and calcium oxalate crystal in her urine.

## 2013-04-17 NOTE — H&P (Signed)
HPI: Carla Singleton is a 32 y.o. G3P2002 at unknown GA referred here from [facility] where she was evaluated today for severe lower abdominal pains. Pain first occurred shortly after awakening today. She desribes it as sharp, stabbing and radiating to vagina and low back. Has chronic LBP and tried one Norco this am with little relief. Nausea but no vomiting and is hungry now. Unaware of pregnancy until UPT done today. Denies hematuria, dysuria, increased urination or vaginal discharge. Has Mirena in place; amenorrheic on IUD. Has had postcoital bleeding, last episode 2 weeks ago.  Past Medical History   Diagnosis  Date   .  Kidney stones     OB History   Gravida  Para  Term  Preterm  AB  SAB  TAB  Ectopic  Multiple  Living   2  2  2        2      #  Outcome  Date  GA  Lbr Len/2nd  Weight  Sex  Delivery  Anes  PTL  Lv   2  TRM  08/11/07  [redacted]w[redacted]d    F  SVD    Y   1  TRM  04/29/05  [redacted]w[redacted]d    M  SVD   N  Y     History reviewed. No pertinent past surgical history.  History    Social History   .  Marital Status:  Married     Spouse Name:  N/A     Number of Children:  N/A   .  Years of Education:  N/A    Occupational History   .  Not on file.    Social History Main Topics   .  Smoking status:  Current Every Day Smoker   .  Smokeless tobacco:  Not on file   .  Alcohol Use:  Yes      Comment: occassionally   .  Drug Use:  No   .  Sexual Activity:  Not on file    Other Topics  Concern   .  Not on file    Social History Narrative   .  No narrative on file    No current facility-administered medications on file prior to encounter.    Current Outpatient Prescriptions on File Prior to Encounter   Medication  Sig  Dispense  Refill   .  HYDROcodone-acetaminophen (NORCO) 10-325 MG per tablet  Take 1 tablet by mouth 2 (two) times daily.     No Known Allergies  ROS: Pertinent items in HPI  OBJECTIVE  Blood pressure 109/72, pulse 60, temperature 98.7 F (37.1 C), resp.  rate 20.  GENERAL: Well-developed, well-nourished female in apparent acute distress, moaning, wincing and holding her abdomen periodically during interview and exam.  HEENT: Normocephalic  HEART: normal rate  RESP: normal effort  ABDOMEN: Soft, flat. Lower abdomen tender throughout with guarding and apparent rebound. Localized most to mid lower LLQ tender  EXTREMITIES: Nontender, no edema  NEURO: Alert and oriented  LAB RESULTS  Results for orders placed in visit on 04/17/13 (from the past 24 hour(s))   POCT URINALYSIS DIPSTICK Status: None    Collection Time    04/17/13 4:49 PM   Result  Value  Range    Color, UA  yellow     Clarity, UA  clear     Glucose, UA  neg     Bilirubin, UA  neg     Ketones, UA  neg  Spec Grav, UA  >=1.030     Blood, UA  neg     pH, UA  6.0     Protein, UA  neg     Urobilinogen, UA  1.0     Nitrite, UA  neg     Leukocytes, UA  Negative    POCT UA - MICROSCOPIC ONLY Status: None    Collection Time    04/17/13 5:01 PM   Result  Value  Range    WBC, Ur, HPF, POC  9-23     RBC, urine, microscopic  1-3     Bacteria, U Microscopic  3+     Mucus, UA  pos     Epithelial cells, urine per micros  2-4     Crystals, Ur, HPF, POC  calcium oxalate     Casts, Ur, LPF, POC  neg     Yeast, UA  neg    POCT CBC Status: Abnormal    Collection Time    04/17/13 5:01 PM   Result  Value  Range    WBC  9.3  4.6 - 10.2 K/uL    Lymph, poc  2.6  0.6 - 3.4    POC LYMPH PERCENT  28.3  10 - 50 %L    MID (cbc)  0.6  0 - 0.9    POC MID %  6.0  0 - 12 %M    POC Granulocyte  6.1  2 - 6.9    Granulocyte percent  65.7  37 - 80 %G    RBC  4.72  4.04 - 5.48 M/uL    Hemoglobin  13.6  12.2 - 16.2 g/dL    HCT, POC  16.1  09.6 - 47.9 %    MCV  92.4  80 - 97 fL    MCH, POC  28.8  27 - 31.2 pg    MCHC  31.2 (*)  31.8 - 35.4 g/dL    RDW, POC  04.5     Platelet Count, POC  192  142 - 424 K/uL    MPV  10.0  0 - 99.8 fL   POCT URINE PREGNANCY Status: None    Collection Time     04/17/13 5:32 PM   Result  Value  Range    Preg Test, Ur  Positive     U/S shows 9 cm left ectopic pregnancy, IUD seen in otherwise empty uterus  A/P. Ectopic pregnancy- plan for laparoscopic removal of affected tube. I also consented her for a possible laparotomy.  She understands the risks of surgery, including, but not to infection, bleeding, DVTs, damage to bowel, bladder, ureters. She wishes to proceed.

## 2013-04-18 LAB — GC/CHLAMYDIA PROBE AMP
CT Probe RNA: NEGATIVE
GC Probe RNA: NEGATIVE

## 2013-04-20 ENCOUNTER — Encounter (HOSPITAL_COMMUNITY): Payer: Self-pay | Admitting: Obstetrics & Gynecology

## 2013-04-22 ENCOUNTER — Ambulatory Visit
Admission: RE | Admit: 2013-04-22 | Discharge: 2013-04-22 | Disposition: A | Discharge status: Home / Self Care | Payer: BC Managed Care – PPO | Source: Ambulatory Visit | Attending: Internal Medicine | Admitting: Internal Medicine

## 2013-04-22 DIAGNOSIS — C220 Liver cell carcinoma: Secondary | ICD-10-CM

## 2013-06-03 ENCOUNTER — Ambulatory Visit (INDEPENDENT_AMBULATORY_CARE_PROVIDER_SITE_OTHER): Payer: BC Managed Care – PPO | Admitting: Obstetrics & Gynecology

## 2013-06-03 ENCOUNTER — Encounter: Payer: Self-pay | Admitting: Obstetrics & Gynecology

## 2013-06-03 VITALS — BP 114/73 | HR 68 | Temp 97.0°F | Ht 71.0 in | Wt 132.4 lb

## 2013-06-03 DIAGNOSIS — Z09 Encounter for follow-up examination after completed treatment for conditions other than malignant neoplasm: Secondary | ICD-10-CM

## 2013-06-03 NOTE — Progress Notes (Signed)
   Subjective:    Patient ID: Carla Singleton, female    DOB: 1980/12/30, 33 y.o.   MRN: [id number]  HPI  Carla Singleton is here for a post op visit. She had a ruptured left ectopic pregnancy 6 weeks ago and had laparoscopy and removal of her left oviduct. She denies any post op problems.  Review of Systems She reports that she gets her pap smears at [facility] and that it is UTD and normal. She has a Corporate treasurer.    Objective:   Physical Exam  Well-healed incisions      Assessment & Plan:  Post op doing well RTC prn

## 2014-03-08 ENCOUNTER — Encounter: Payer: Self-pay | Admitting: Obstetrics & Gynecology

## 2017-09-17 ENCOUNTER — Encounter: Payer: Self-pay | Admitting: Internal Medicine

## 2017-09-17 ENCOUNTER — Ambulatory Visit (INDEPENDENT_AMBULATORY_CARE_PROVIDER_SITE_OTHER): Payer: BLUE CROSS/BLUE SHIELD | Admitting: Internal Medicine

## 2017-09-17 DIAGNOSIS — B182 Chronic viral hepatitis C: Secondary | ICD-10-CM | POA: Insufficient documentation

## 2017-09-17 NOTE — Progress Notes (Signed)
[facility]   CC: consideration for treatment for chronic hepatitis C  HPI:  +Carla Singleton is a 37 y.o. female who presents for initial evaluation and management of chronic hepatitis C.  Patient tested positive over 8 years ago. Hepatitis C-associated risk factors present are: none. Patient denies history of blood transfusion, intranasal drug use, IV drug abuse, renal dialysis, tattoos. Patient has not had other studies performed. Results: none. Patient has not had prior treatment for Hepatitis C. Patient does not have a past history of liver disease. Patient does not have a family history of liver disease. Patient does not  have associated signs or symptoms related to liver disease.   Records reviewed from Dr. Melba Coon and [facility] and no positive Ab noted.  She did see Dr. Patsy Baltimore in 2011 but told she didn't qualify for treatment due to not having enough fibrosis.      Patient does not have documented immunity to Hepatitis A. Patient does not have documented immunity to Hepatitis B.    Review of Systems:  Constitutional: positive for fatigue or negative for anorexia and weight loss Gastrointestinal: negative for diarrhea Integument/breast: negative for rash All other systems reviewed and are negative       Past Medical History:  Diagnosis Date  . Kidney stones     Prior to Admission medications   Medication Sig Start Date End Date Taking? Authorizing Provider  HYDROcodone-acetaminophen (NORCO) 10-325 MG per tablet Take 1 tablet by mouth 2 (two) times daily.    Yes [provider]  ibuprofen (ADVIL,MOTRIN) 800 MG tablet Take 1 tablet (800 mg total) by mouth every 8 (eight) hours as needed. 04/17/13   Emily Filbert, MD  oxyCODONE-acetaminophen (PERCOCET/ROXICET) 5-325 MG per tablet Take 1 tablet by mouth every 4 (four) hours as needed for severe pain. Patient not taking: Reported on 09/17/2017 04/17/13   Emily Filbert, MD    No Known  Allergies  Social History   Tobacco Use  . Smoking status: Current Every Day Smoker  . Smokeless tobacco: Never Used  Substance Use Topics  . Alcohol use: Yes    Comment: occassionally  . Drug use: No    Family History  Problem Relation Age of Onset  . Stroke Maternal Grandmother   . Cancer Maternal Grandfather      Objective:  Constitutional: in no apparent distress and alert,  Blood pressure 120/78, pulse 62, temperature 97.7 F (36.5 C), temperature source Oral, height 5' 11.5" (1.816 m), weight 149 lb (67.6 kg). Eyes: anicteric Cardiovascular: Cor RRR and No murmurs Respiratory: CTA B; normal respiratory effort Gastrointestinal: Bowel sounds are normal, liver is not enlarged, spleen is not enlarged Musculoskeletal: no pedal edema noted Skin: negatives: no rash; no porphyria cutanea tarda Lymphatic: no cervical lymphadenopathy   Laboratory Genotype: No results found for: HCVGENOTYPE HCV viral load: No results found for: HCVQUANT Lab Results  Component Value Date   WBC 9.3 04/17/2013   HGB 13.6 04/17/2013   HCT 43.6 04/17/2013   MCV 92.4 04/17/2013   PLT 160 01/04/2012    Lab Results  Component Value Date   CREATININE 0.60 01/04/2012   BUN 9 01/04/2012   NA 139 01/04/2012   K 3.8 01/04/2012   CL 105 01/04/2012   CO2 28 01/04/2012    Lab Results  Component Value Date   ALT 15 05/24/2007   AST 21 05/24/2007   ALKPHOS 90 05/24/2007     Labs and history reviewed  and show CHILD-PUGH unknown  5-6 points: Child class A 7-9 points: Child class B 10-15 points: Child class C  Lab Results  Component Value Date   BILITOT 0.8 05/24/2007   ALBUMIN 2.7 (L) 05/24/2007     Assessment: New Patient with Chronic Hepatitis C genotype unknown, untreated.  I discussed with the patient the lab findings that confirm chronic hepatitis C as well as the natural history and progression of disease including about 30% of people who develop cirrhosis of the liver if left  untreated and once cirrhosis is established there is a 2-7% risk per year of liver cancer and liver failure.  I discussed the importance of treatment and benefits in reducing the risk, even if significant liver fibrosis exists.   Plan: 1) Patient counseled extensively on limiting acetaminophen to no more than 2 grams daily, avoidance of alcohol. 2) Transmission discussed with patient including sexual transmission, sharing razors and toothbrush.   3) Will need referral to gastroenterology if concern for cirrhosis 4) Will need referral for substance abuse counseling: No.; Further work up to include urine drug screen  No. 5) Will prescribe appropriate medication based on genotype and coverage  6) Hepatitis A and B titers 7) Pneumovax vaccine if not previously given 9) Further work up to include liver staging with elastography 10) will follow up after above work up

## 2017-09-18 LAB — CBC WITH DIFFERENTIAL/PLATELET
BASOS ABS: 16 {cells}/uL (ref 0–200)
BASOS PCT: 0.2 %
Eosinophils Absolute: 178 cells/uL (ref 15–500)
Eosinophils Relative: 2.2 %
HEMATOCRIT: 41.7 % (ref 35.0–45.0)
Hemoglobin: 14.6 g/dL (ref 11.7–15.5)
LYMPHS ABS: 2187 {cells}/uL (ref 850–3900)
MCH: 30 pg (ref 27.0–33.0)
MCHC: 35 g/dL (ref 32.0–36.0)
MCV: 85.8 fL (ref 80.0–100.0)
MONOS PCT: 4.3 %
MPV: 10.8 fL (ref 7.5–12.5)
NEUTROS ABS: 5370 {cells}/uL (ref 1500–7800)
Neutrophils Relative %: 66.3 %
Platelets: 181 10*3/uL (ref 140–400)
RBC: 4.86 10*6/uL (ref 3.80–5.10)
RDW: 11.4 % (ref 11.0–15.0)
Total Lymphocyte: 27 %
WBC: 8.1 10*3/uL (ref 3.8–10.8)
WBCMIX: 348 {cells}/uL (ref 200–950)

## 2017-09-18 LAB — COMPLETE METABOLIC PANEL WITH GFR
AG Ratio: 1.6 (calc) (ref 1.0–2.5)
ALT: 12 U/L (ref 6–29)
AST: 15 U/L (ref 10–30)
Albumin: 4.3 g/dL (ref 3.6–5.1)
Alkaline phosphatase (APISO): 79 U/L (ref 33–115)
BUN: 12 mg/dL (ref 7–25)
CALCIUM: 9.3 mg/dL (ref 8.6–10.2)
CO2: 21 mmol/L (ref 20–32)
CREATININE: 0.68 mg/dL (ref 0.50–1.10)
Chloride: 106 mmol/L (ref 98–110)
GFR, EST AFRICAN AMERICAN: 131 mL/min/{1.73_m2} (ref >=60)
GFR, EST NON AFRICAN AMERICAN: 113 mL/min/{1.73_m2} (ref >=60)
GLUCOSE: 97 mg/dL (ref 65–99)
Globulin: 2.7 g/dL (calc) (ref 1.9–3.7)
Potassium: 3.9 mmol/L (ref 3.5–5.3)
Sodium: 140 mmol/L (ref 135–146)
TOTAL PROTEIN: 7 g/dL (ref 6.1–8.1)
Total Bilirubin: 0.6 mg/dL (ref 0.2–1.2)

## 2017-09-18 LAB — HEPATITIS B SURFACE ANTIBODY,QUALITATIVE: Hep B S Ab: REACTIVE — AB

## 2017-09-18 LAB — HIV ANTIBODY (ROUTINE TESTING W REFLEX): HIV 1&2 Ab, 4th Generation: NONREACTIVE

## 2017-09-18 LAB — PROTIME-INR
INR: 1
PROTHROMBIN TIME: 10.4 s (ref 9.0–11.5)

## 2017-09-18 LAB — HEPATITIS A ANTIBODY, TOTAL: HEPATITIS A AB,TOTAL: NONREACTIVE

## 2017-09-18 LAB — HEPATITIS B CORE ANTIBODY, TOTAL: Hep B Core Total Ab: NONREACTIVE

## 2017-09-18 LAB — HEPATITIS B SURFACE ANTIGEN: Hepatitis B Surface Ag: NONREACTIVE

## 2017-09-19 ENCOUNTER — Encounter: Payer: Self-pay | Admitting: Internal Medicine

## 2017-09-19 ENCOUNTER — Ambulatory Visit (HOSPITAL_COMMUNITY)
Admission: RE | Admit: 2017-09-19 | Discharge: 2017-09-19 | Disposition: A | Discharge status: Home / Self Care | Payer: BLUE CROSS/BLUE SHIELD | Source: Ambulatory Visit | Attending: Internal Medicine | Admitting: Internal Medicine

## 2017-09-19 DIAGNOSIS — B182 Chronic viral hepatitis C: Secondary | ICD-10-CM | POA: Diagnosis present

## 2017-09-19 LAB — HEPATITIS C RNA QUANTITATIVE
HCV QUANT LOG: 6.61 {Log_IU}/mL — AB
HCV RNA, PCR, QN: 4110000 IU/mL — ABNORMAL HIGH

## 2017-09-21 LAB — HEPATITIS C GENOTYPE

## 2017-09-23 LAB — LIVER FIBROSIS, FIBROTEST-ACTITEST
ALT: 12 U/L (ref 6–29)
Alpha-2-Macroglobulin: 183 mg/dL (ref 106–279)
Apolipoprotein A1: 127 mg/dL (ref 101–198)
BILIRUBIN: 0.5 mg/dL (ref 0.2–1.2)
Fibrosis Score: 0.12
GGT: 19 U/L (ref 3–50)
HAPTOGLOBIN: 108 mg/dL (ref 43–212)
NECROINFLAMMAT ACT SCORE: 0.03
Reference ID: 2470474

## 2017-09-24 ENCOUNTER — Encounter: Payer: Self-pay | Admitting: Internal Medicine

## 2017-09-24 ENCOUNTER — Ambulatory Visit (INDEPENDENT_AMBULATORY_CARE_PROVIDER_SITE_OTHER): Payer: BLUE CROSS/BLUE SHIELD | Admitting: Internal Medicine

## 2017-09-24 VITALS — BP 119/74 | HR 70 | Temp 98.6°F | Wt 149.0 lb

## 2017-09-24 DIAGNOSIS — K7400 Hepatic fibrosis, unspecified: Secondary | ICD-10-CM

## 2017-09-24 DIAGNOSIS — B182 Chronic viral hepatitis C: Secondary | ICD-10-CM | POA: Diagnosis not present

## 2017-09-24 DIAGNOSIS — Z7189 Other specified counseling: Secondary | ICD-10-CM

## 2017-09-24 DIAGNOSIS — Z23 Encounter for immunization: Secondary | ICD-10-CM

## 2017-09-24 DIAGNOSIS — Z7185 Encounter for immunization safety counseling: Secondary | ICD-10-CM

## 2017-09-24 MED ORDER — LEDIPASVIR-SOFOSBUVIR 90-400 MG PO TABS: 1.0000 | ORAL_TABLET | Freq: Every day | ORAL | 2 refills | Status: DC

## 2017-09-24 NOTE — Patient Instructions (Signed)
Date 09/24/17  Dear Ms Blanca Friend, As discussed in the [facility], your hepatitis C therapy will include the following medications:          Harvoni 90mg /400mg  tablet:           Take 1 tablet by mouth once daily   Please note that ALL MEDICATIONS WILL START ON THE SAME DATE for a total of 12 weeks. ---------------------------------------------------------------- Your HCV Treatment Start Date: TBA   Your HCV genotype:  1a    Liver Fibrosis: F2/3  ---------------------------------------------------------------- YOUR PHARMACY CONTACT:   [facility] [address] [place], [place] [address] Phone: [phone] Hours: Monday to Friday 7:30 am to 6:00 pm   Please always contact your pharmacy at least 3-4 business days before you run out of medications to ensure your next month's medication is ready or 1 week prior to running out if you receive it by mail.  Remember, each prescription is for 28 days. ---------------------------------------------------------------- GENERAL NOTES REGARDING YOUR HEPATITIS C MEDICATION:  SOFOSBUVIR/LEDIPASVIR (HARVONI): - Harvoni tablet is taken daily with OR without food. - The tablets are orange. - The tablets should be stored at room temperature.  - Acid reducing agents such as H2 blockers (ie. Pepcid (famotidine), Zantac (ranitidine), Tagamet (cimetidine), Axid (nizatidine) and proton pump inhibitors (ie. Prilosec (omeprazole), Protonix (pantoprazole), Nexium (esomeprazole), or Aciphex (rabeprazole)) can decrease effectiveness of Harvoni. Do not take until you have discussed with a health care provider.    -Antacids that contain magnesium and/or aluminum hydroxide (ie. Milk of Magensia, Rolaids, Gaviscon, Maalox, Mylanta, an dArthritis Pain Formula)can reduce absorption of Harvoni, so take them at least 4 hours before or after Harvoni.  -Calcium carbonate (calcium supplements or antacids such as Tums, Caltrate, Os-Cal)needs to be taken at  least 4 hours hours before or after Harvoni.  -St. John's wort or any products that contain St. John's wort like some herbal supplements  Please inform the office prior to starting any of these medications.  - The common side effects associated with Harvoni include:      1. Fatigue      2. Headache      3. Nausea      4. Diarrhea      5. Insomnia  Please note that this only lists the most common side effects and is NOT a comprehensive list of the potential side effects of these medications. For more information, please review the drug information sheets that come with your medication package from the pharmacy.  ---------------------------------------------------------------- GENERAL HELPFUL HINTS ON HCV THERAPY: 1. Stay well-hydrated. 2. Notify the [facility] Clinic of any changes in your other over-the-counter/herbal or prescription medications. 3. If you miss a dose of your medication, take the missed dose as soon as you remember. Return to your regular time/dose schedule the next day.  4.  Do not stop taking your medications without first talking with your healthcare provider. 5.  You may take Tylenol (acetaminophen), as long as the dose is less than 2000 mg (OR no more than 4 tablets of the Tylenol Extra Strengths 500mg  tablet) in 24 hours. 6.  You will see our pharmacist-specialist within the first 2 weeks of starting your medication to monitor for any possible side effects. 7.  You will have labs once during treatment, after soon after treatment completion and one final lab 6 months after treatment completion to verify the virus is out of your system.  Thayer Headings, [facility]  [address] [place], [place]  [address] [phone]

## 2017-09-24 NOTE — Progress Notes (Signed)
   Subjective:    Patient ID: Carla Singleton, female    DOB: 06/21/1980, 37 y.o.   MRN: [id number]  HPI Here for follow up of chronic hepatitis C. Diagnosed several years ago but did not at the time qualify for treatment.  Has genotype 1a.  Elastography done and F2-3 and Fibrosure F0.  No new issues.  Hepatitis B immune, hepatitis A non-immune. No associated fatigue.    Review of Systems  Constitutional: Negative for fatigue.  Gastrointestinal: Negative for diarrhea and nausea.  Skin: Negative for rash.       Objective:   Physical Exam  Constitutional: She appears well-developed and well-nourished. No distress.  HENT:  Mouth/Throat: No oropharyngeal exudate.  Eyes: No scleral icterus.  Cardiovascular: Normal rate, regular rhythm and normal heart sounds.  No murmur heard. Pulmonary/Chest: Effort normal and breath sounds normal.  Skin: No rash noted.    SH: + tobacco      Assessment & Plan:

## 2017-09-25 DIAGNOSIS — K7400 Hepatic fibrosis, unspecified: Secondary | ICD-10-CM | POA: Insufficient documentation

## 2017-09-25 NOTE — Assessment & Plan Note (Signed)
Will prescribe Harvoni for 12 weeks.  Once approved she will start and get scheduled for follow up. Side effects discussed.

## 2017-09-26 DIAGNOSIS — Z7185 Encounter for immunization safety counseling: Secondary | ICD-10-CM | POA: Insufficient documentation

## 2017-09-26 DIAGNOSIS — Z7189 Other specified counseling: Secondary | ICD-10-CM | POA: Insufficient documentation

## 2017-09-26 NOTE — Assessment & Plan Note (Signed)
Some fibrosis on elastography but F0 on Fibrosure with platelets > 150,000.  No significant concerns.  No ongoing [place] screening indicated.

## 2017-09-26 NOTE — Assessment & Plan Note (Signed)
Counseled on the hepatitis A vaccine and given.

## 2017-10-01 ENCOUNTER — Other Ambulatory Visit: Payer: Self-pay | Admitting: Pharmacist Clinician (PhC)/ Clinical Pharmacy Specialist

## 2017-10-01 MED ORDER — LEDIPASVIR-SOFOSBUVIR 90-400 MG PO TABS: 1.0000 | ORAL_TABLET | Freq: Every day | ORAL | 2 refills | Status: DC

## 2017-10-01 NOTE — Progress Notes (Signed)
Have to be transferred to a specialty pharmacy. We will try [facility] first.

## 2017-10-14 ENCOUNTER — Encounter: Payer: Self-pay | Admitting: Pharmacy Technician

## 2017-10-29 ENCOUNTER — Ambulatory Visit (INDEPENDENT_AMBULATORY_CARE_PROVIDER_SITE_OTHER): Payer: BLUE CROSS/BLUE SHIELD | Admitting: Pharmacist Clinician (PhC)/ Clinical Pharmacy Specialist

## 2017-10-29 DIAGNOSIS — B182 Chronic viral hepatitis C: Secondary | ICD-10-CM

## 2017-10-29 NOTE — Progress Notes (Signed)
HPI: Carla Singleton is a 37 y.o. female who is here to see pharmacy for her hep C follow up.   Lab Results  Component Value Date   HCVGENOTYPE 1a 09/17/2017    Allergies: No Known Allergies  Vitals:    Past Medical History: Past Medical History:  Diagnosis Date  . Kidney stones     Social History: Social History   Socioeconomic History  . Marital status: Married    Spouse name: Not on file  . Number of children: Not on file  . Years of education: Not on file  . Highest education level: Not on file  Occupational History  . Not on file  Social Needs  . Financial resource strain: Not on file  . Food insecurity:    Worry: Not on file    Inability: Not on file  . Transportation needs:    Medical: Not on file    Non-medical: Not on file  Tobacco Use  . Smoking status: Current Every Day Smoker  . Smokeless tobacco: Never Used  Substance and Sexual Activity  . Alcohol use: Yes    Comment: occassionally  . Drug use: No  . Sexual activity: Not on file  Lifestyle  . Physical activity:    Days per week: Not on file    Minutes per session: Not on file  . Stress: Not on file  Relationships  . Social connections:    Talks on phone: Not on file    Gets together: Not on file    Attends religious service: Not on file    Active member of club or organization: Not on file    Attends meetings of clubs or organizations: Not on file    Relationship status: Not on file  Other Topics Concern  . Not on file  Social History Narrative  . Not on file    Labs: Hep B S Ab (no units)  Date Value  09/17/2017 REACTIVE (A)   Hepatitis B Surface Ag (no units)  Date Value  09/17/2017 NON-REACTIVE    Lab Results  Component Value Date   HCVGENOTYPE 1a 09/17/2017    Hepatitis C RNA quantitative Latest Ref Rng & Units 09/17/2017  HCV Quantitative Log NOT DETECT Log IU/mL 6.61(H)    AST  Date Value  09/17/2017 15 U/L  05/24/2007 21   ALT  Date Value  09/17/2017 12  U/L  09/17/2017 12 U/L  05/24/2007 15   INR (no units)  Date Value  09/17/2017 1.0    CrCl: CrCl cannot be calculated (Patient's most recent lab result is older than the maximum 21 days allowed.).  Fibrosis Score: F2/3 as assessed by [facility]FI  Child-Pugh Score: Class A  Previous Treatment Regimen: None  Assessment: Carla Singleton started on Harvoni on 5/30. She has about 2 tablets left and has gotten the next month supply already. Since she fits all of the criteria for the 8 wks regimen, she only got approved for that length only. She has not missed any doses or side effects. Encouraged her to keep up the good work for another month. She will come back here for the EOT visit with pharmacy. Subsequently, she will get the [facility] before seeing pharmacy for the cure visit. All appts have been made.   Apparently, her husband is also being treated for genotype 3 with Mavyret but he is not taking it with food. Advised her to tell her husband to take it with food. He is seeing a provider in [place].  Recommendations:  Continue Havoni x 8 wks F/u with pharmacy at the EOT visit [id number] then f/u with pharmacy  [facility], Pharm.D., BCPS, AAHIVP Clinical Infectious [place] Infect[facility] 10/29/2017, 3:26 PM

## 2017-10-30 LAB — COMPLETE METABOLIC PANEL WITH GFR
AG Ratio: 1.6 (calc) (ref 1.0–2.5)
ALBUMIN MSPROF: 4.2 g/dL (ref 3.6–5.1)
ALT: 5 U/L — ABNORMAL LOW (ref 6–29)
AST: 11 U/L (ref 10–30)
Alkaline phosphatase (APISO): 76 U/L (ref 33–115)
BILIRUBIN TOTAL: 0.6 mg/dL (ref 0.2–1.2)
BUN: 12 mg/dL (ref 7–25)
CO2: 26 mmol/L (ref 20–32)
CREATININE: 0.64 mg/dL (ref 0.50–1.10)
Calcium: 9.4 mg/dL (ref 8.6–10.2)
Chloride: 108 mmol/L (ref 98–110)
GFR, EST AFRICAN AMERICAN: 134 mL/min/{1.73_m2} (ref >=60)
GFR, EST NON AFRICAN AMERICAN: 116 mL/min/{1.73_m2} (ref >=60)
GLUCOSE: 110 mg/dL — AB (ref 65–99)
Globulin: 2.6 g/dL (calc) (ref 1.9–3.7)
Potassium: 3.9 mmol/L (ref 3.5–5.3)
Sodium: 141 mmol/L (ref 135–146)
TOTAL PROTEIN: 6.8 g/dL (ref 6.1–8.1)

## 2017-11-01 LAB — HEPATITIS C RNA QUANTITATIVE
HCV QUANT LOG: NOT DETECTED {Log_IU}/mL
HCV RNA, PCR, QN: NOT DETECTED [IU]/mL

## 2017-11-15 ENCOUNTER — Other Ambulatory Visit: Payer: Self-pay | Admitting: Internal Medicine

## 2017-12-03 ENCOUNTER — Ambulatory Visit (INDEPENDENT_AMBULATORY_CARE_PROVIDER_SITE_OTHER): Payer: BLUE CROSS/BLUE SHIELD | Admitting: Pharmacist

## 2017-12-03 DIAGNOSIS — B182 Chronic viral hepatitis C: Secondary | ICD-10-CM | POA: Diagnosis not present

## 2017-12-03 NOTE — Progress Notes (Signed)
HPI: Carla Singleton is a 37 y.o. female who presents to the [facility] for her EOT Hep C follow-up visit.  Hepatitis C Genotype: 1a  Fibrosis Score: F2/F3  Hepatitis C RNA: <15  Patient Active Problem List   Diagnosis Date Noted  . Vaccine counseling 09/26/2017  . Liver fibrosis 09/25/2017  . Chronic hepatitis C without hepatic coma ([place]) 09/17/2017  . Ectopic pregnancy 04/17/2013    Patient's Medications  New Prescriptions   No medications on file  Previous Medications   FLUTICASONE (FLONASE) 50 MCG/ACT NASAL SPRAY    fluticasone propionate 50 mcg/actuation nasal spray,suspension  SHAKE LQ AND U 1 SPR IEN D   HYDROCODONE-ACETAMINOPHEN (NORCO) 10-325 MG PER TABLET    Take 1 tablet by mouth 2 (two) times daily.    IBUPROFEN (ADVIL,MOTRIN) 800 MG TABLET    Take 1 tablet (800 mg total) by mouth every 8 (eight) hours as needed.   LEDIPASVIR-SOFOSBUVIR 90-400 MG TABS    TAKE ONE Tablet BY MOUTH EVERY DAY  Modified Medications   No medications on file  Discontinued Medications   No medications on file    Allergies: No Known Allergies  Past Medical History: Past Medical History:  Diagnosis Date  . Kidney stones     Social History: Social History   Socioeconomic History  . Marital status: Married    Spouse name: Not on file  . Number of children: Not on file  . Years of education: Not on file  . Highest education level: Not on file  Occupational History  . Not on file  Social Needs  . Financial resource strain: Not on file  . Food insecurity:    Worry: Not on file    Inability: Not on file  . Transportation needs:    Medical: Not on file    Non-medical: Not on file  Tobacco Use  . Smoking status: Current Every Day Smoker  . Smokeless tobacco: Never Used  Substance and Sexual Activity  . Alcohol use: Yes    Comment: occassionally  . Drug use: No  . Sexual activity: Not on file  Lifestyle  . Physical activity:    Days per week: Not on file      Minutes per session: Not on file  . Stress: Not on file  Relationships  . Social connections:    Talks on phone: Not on file    Gets together: Not on file    Attends religious service: Not on file    Active member of club or organization: Not on file    Attends meetings of clubs or organizations: Not on file    Relationship status: Not on file  Other Topics Concern  . Not on file  Social History Narrative  . Not on file    Labs: Hepatitis C Lab Results  Component Value Date   HCVGENOTYPE 1a 09/17/2017   HCVRNAPCRQN <15 NOT DETECTED 10/29/2017   HCVRNAPCRQN 4,110,000 (H) 09/17/2017   FIBROSTAGE F0 09/17/2017   Hepatitis B Lab Results  Component Value Date   HEPBSAB REACTIVE (A) 09/17/2017   HEPBSAG NON-REACTIVE 09/17/2017   HEPBCAB NON-REACTIVE 09/17/2017   Hepatitis A Lab Results  Component Value Date   HAV NON-REACTIVE 09/17/2017   HIV Lab Results  Component Value Date   HIV NON-REACTIVE 09/17/2017   Lab Results  Component Value Date   CREATININE 0.64 10/29/2017   CREATININE 0.68 09/17/2017   CREATININE 0.60 01/04/2012   CREATININE 0.60 12/04/2011   CREATININE 0.8  05/09/2008   Lab Results  Component Value Date   AST 11 10/29/2017   AST 15 09/17/2017   AST 21 05/24/2007   ALT 5 (L) 10/29/2017   ALT 12 09/17/2017   ALT 12 09/17/2017   INR 1.0 09/17/2017    Assessment: Carla Singleton is here today to follow-up for her Hep C infection.  She recently completed 8 weeks of Harvoni and took her last dose on Thursday of last week.  She did not miss any doses and did not have any side effects.  Her early on treatment Hep C viral load was already undetectable.  Will check another one today and see her in 3 months for her cure visit.   Plan: - Completed Harvoni x 8 weeks - Hep C viral load today - F/u with me for cure visit 11/12 at [facility]. Yazen Rosko, PharmD, [place] [place] [facility] Infectious  D[facility] 12/03/2017, 3:58 PM

## 2017-12-05 LAB — HEPATITIS C RNA QUANTITATIVE
HCV Quantitative Log: <1.18 Log IU/mL
HCV RNA, PCR, QN: <15 IU/mL

## 2018-03-10 ENCOUNTER — Other Ambulatory Visit: Payer: BLUE CROSS/BLUE SHIELD

## 2018-03-18 ENCOUNTER — Ambulatory Visit (INDEPENDENT_AMBULATORY_CARE_PROVIDER_SITE_OTHER): Payer: BLUE CROSS/BLUE SHIELD | Admitting: Pharmacist

## 2018-03-18 DIAGNOSIS — Z23 Encounter for immunization: Secondary | ICD-10-CM | POA: Diagnosis not present

## 2018-03-18 DIAGNOSIS — B182 Chronic viral hepatitis C: Secondary | ICD-10-CM

## 2018-03-18 NOTE — Progress Notes (Signed)
HPI: Carla Singleton is a 37 y.o. female presenting for hepatitis C cure visit. She had G1a, F2/F3 and completed 8 weeks of Harvoni.  Patient Active Problem List   Diagnosis Date Noted  . Vaccine counseling 09/26/2017  . Liver fibrosis 09/25/2017  . Chronic hepatitis C without hepatic coma ([place]) 09/17/2017  . Ectopic pregnancy 04/17/2013    Patient's Medications  New Prescriptions   No medications on file  Previous Medications   FLUTICASONE (FLONASE) 50 MCG/ACT NASAL SPRAY    fluticasone propionate 50 mcg/actuation nasal spray,suspension  SHAKE LQ AND U 1 SPR IEN D   HYDROCODONE-ACETAMINOPHEN (NORCO) 10-325 MG PER TABLET    Take 1 tablet by mouth 2 (two) times daily.    IBUPROFEN (ADVIL,MOTRIN) 800 MG TABLET    Take 1 tablet (800 mg total) by mouth every 8 (eight) hours as needed.   LEDIPASVIR-SOFOSBUVIR 90-400 MG TABS    TAKE ONE Tablet BY MOUTH EVERY DAY  Modified Medications   No medications on file  Discontinued Medications   No medications on file    Allergies: No Known Allergies  Past Medical History: Past Medical History:  Diagnosis Date  . Kidney stones     Social History: Social History   Socioeconomic History  . Marital status: Married    Spouse name: Not on file  . Number of children: Not on file  . Years of education: Not on file  . Highest education level: Not on file  Occupational History  . Not on file  Social Needs  . Financial resource strain: Not on file  . Food insecurity:    Worry: Not on file    Inability: Not on file  . Transportation needs:    Medical: Not on file    Non-medical: Not on file  Tobacco Use  . Smoking status: Current Every Day Smoker  . Smokeless tobacco: Never Used  Substance and Sexual Activity  . Alcohol use: Yes    Comment: occassionally  . Drug use: No  . Sexual activity: Not on file  Lifestyle  . Physical activity:    Days per week: Not on file    Minutes per session: Not on file  . Stress: Not on file    Relationships  . Social connections:    Talks on phone: Not on file    Gets together: Not on file    Attends religious service: Not on file    Active member of club or organization: Not on file    Attends meetings of clubs or organizations: Not on file    Relationship status: Not on file  Other Topics Concern  . Not on file  Social History Narrative  . Not on file    Labs: Hepatitis C Lab Results  Component Value Date   HCVGENOTYPE 1a 09/17/2017   HCVRNAPCRQN <15 NOT DETECTED 12/03/2017   HCVRNAPCRQN <15 NOT DETECTED 10/29/2017   HCVRNAPCRQN 4,110,000 (H) 09/17/2017   FIBROSTAGE F0 09/17/2017   Hepatitis B Lab Results  Component Value Date   HEPBSAB REACTIVE (A) 09/17/2017   HEPBSAG NON-REACTIVE 09/17/2017   HEPBCAB NON-REACTIVE 09/17/2017   Hepatitis A Lab Results  Component Value Date   HAV NON-REACTIVE 09/17/2017   HIV Lab Results  Component Value Date   HIV NON-REACTIVE 09/17/2017   Lab Results  Component Value Date   CREATININE 0.64 10/29/2017   CREATININE 0.68 09/17/2017   CREATININE 0.60 01/04/2012   CREATININE 0.60 12/04/2011   CREATININE 0.8 05/09/2008   Lab Results  Component Value Date   AST 11 10/29/2017   AST 15 09/17/2017   AST 21 05/24/2007   ALT 5 (L) 10/29/2017   ALT 12 09/17/2017   ALT 12 09/17/2017   INR 1.0 09/17/2017   Assessment: Carla Singleton is here for her hepatitis C cure visit. She completed 8 weeks of Harvoni in July. Her HCV viral load was undetectable during treatment. We will check again today for definitive cure. She is also due for her second hepatitis A vaccine and agreed to receive a flu shot.   Plan: HCV RNA Will call with results Hepatitis A vaccine #2/2 Flu shot  Erin N. Gerarda Fraction, PharmD PGY2 Infectious Diseases Pharmacy Resident Phone: [phone] 03/18/2018, 10:25 AM

## 2018-03-20 ENCOUNTER — Telehealth: Payer: Self-pay | Admitting: Pharmacist

## 2018-03-20 LAB — HEPATITIS C RNA QUANTITATIVE
HCV Quantitative Log: <1.18 Log IU/mL
HCV RNA, PCR, QN: NOT DETECTED [IU]/mL

## 2018-03-20 NOTE — Telephone Encounter (Signed)
Called Carla Singleton to let her know her HCV RNA is undetectable at [facility] and that she is cured of hepatitis C.

## 2020-05-03 IMAGING — US US ABDOMEN LIMITED W/ ELASTOGRAPHY
1 series · 13 of 13 positions shown · non-contrast
Comparison: Abdomen ultrasound on 04/22/2013

CLINICAL DATA: Chronic hepatitis-C without hepatic coma.

EXAM:
US ABDOMEN LIMITED - RIGHT UPPER QUADRANT
ULTRASOUND HEPATIC ELASTOGRAPHY
TECHNIQUE: Limited right upper quadrant abdominal ultrasound was performed. In
addition, ultrasound elastography evaluation of the liver was
performed. A region of interest was placed in the right lobe of the
liver. Following application of a compressive sonographic pulse,
shear waves were detected in the adjacent hepatic tissue and the
shear wave velocity was calculated. Multiple assessments were
performed at the selected site. Median shear wave velocity is
correlated to a Metavir fibrosis score.

[Series 1: us abdomen limited w/ elastography · 0.15mm/px · 13 of 13 slices shown]
[im 1/13]
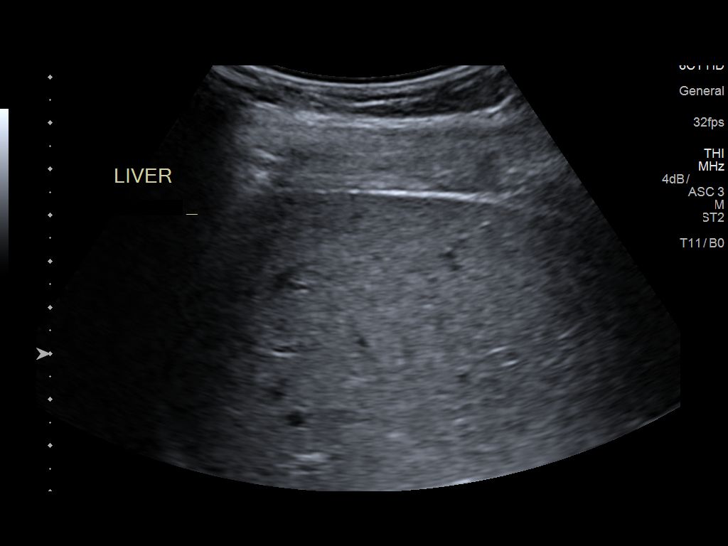
[im 2/13]
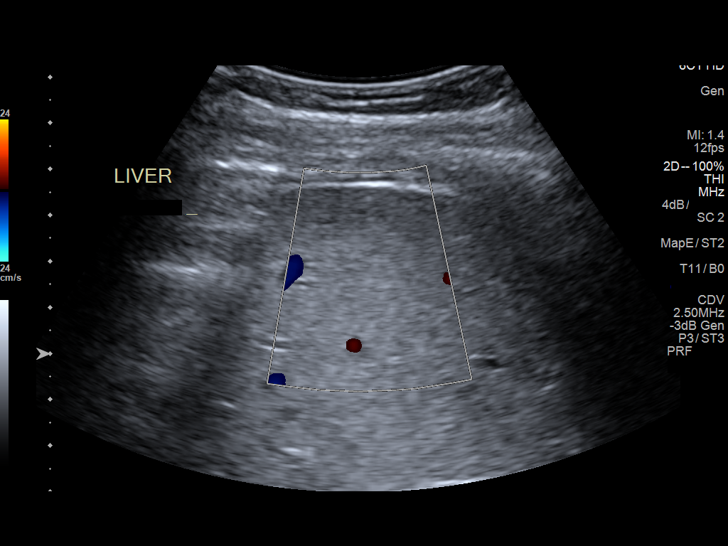
[im 3/13]
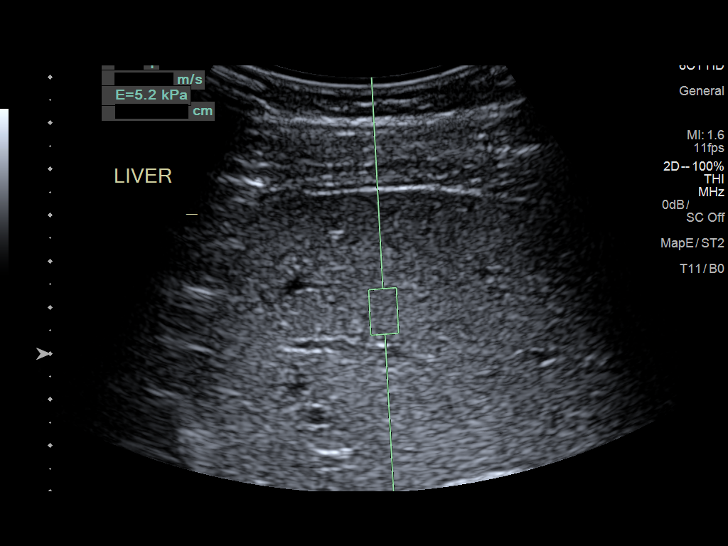
[im 4/13]
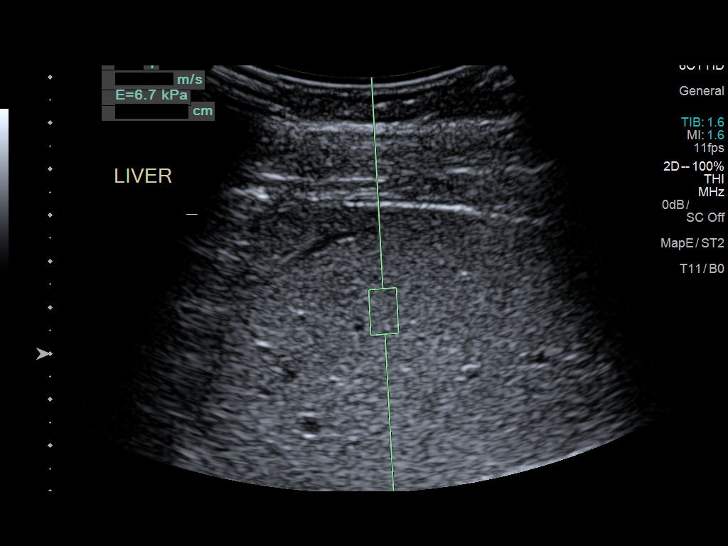
[im 5/13]
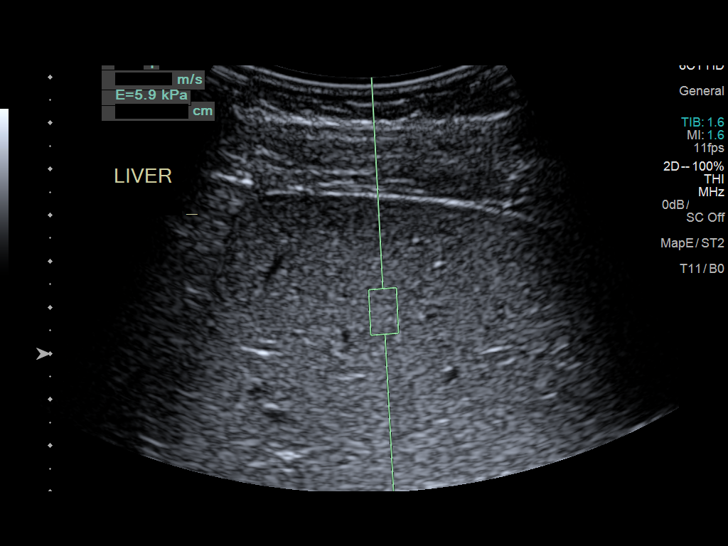
[im 6/13]
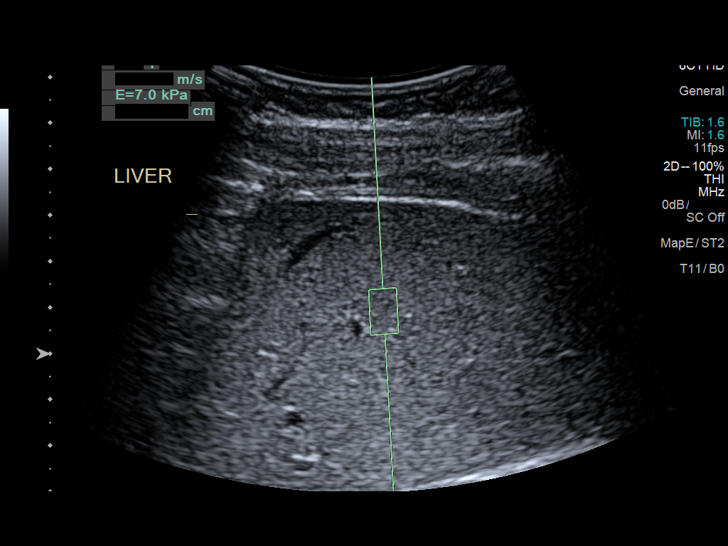
[im 7/13]
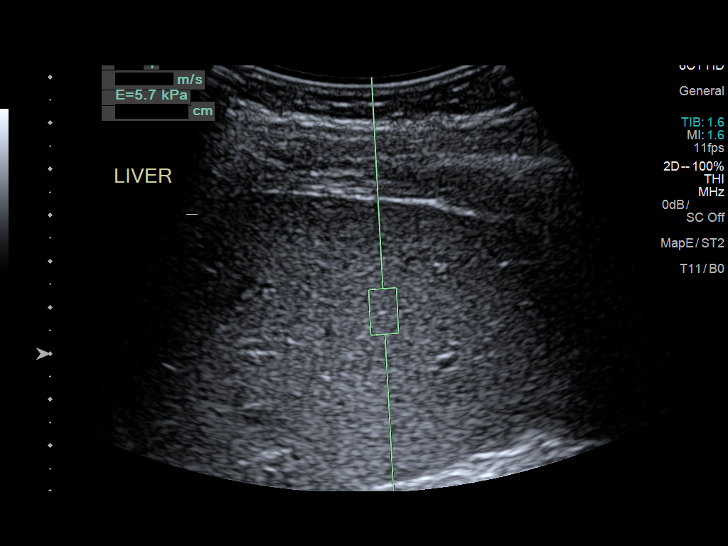
[im 8/13]
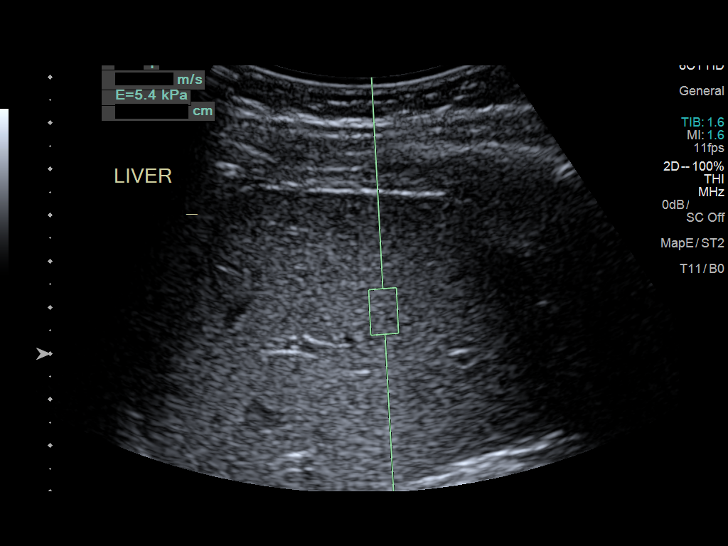
[im 9/13]
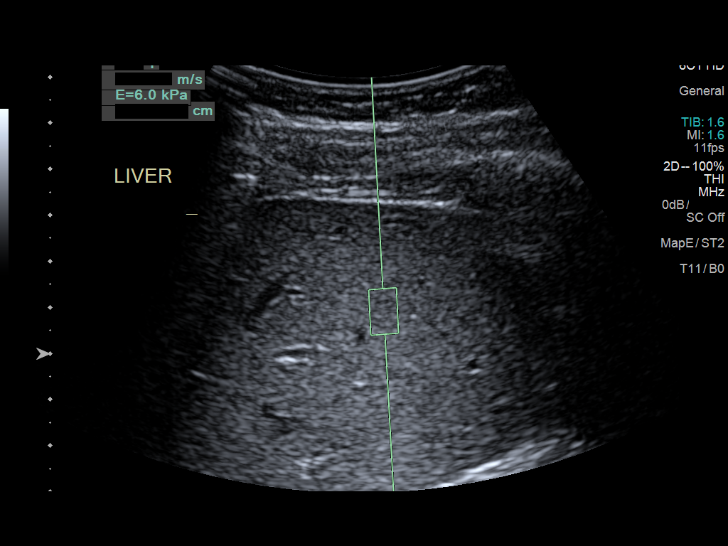
[im 10/13]
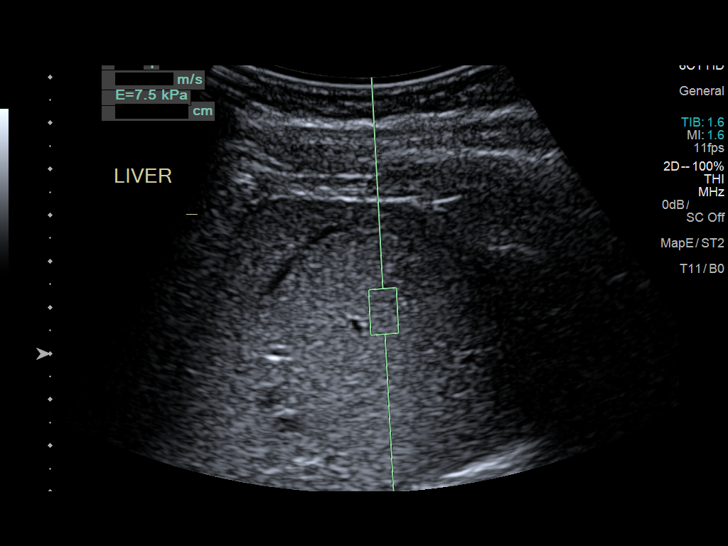
[im 11/13]
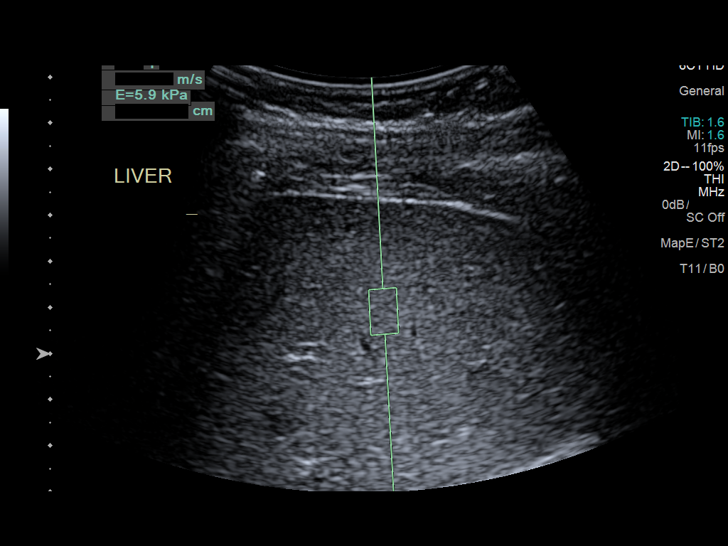
[im 12/13]
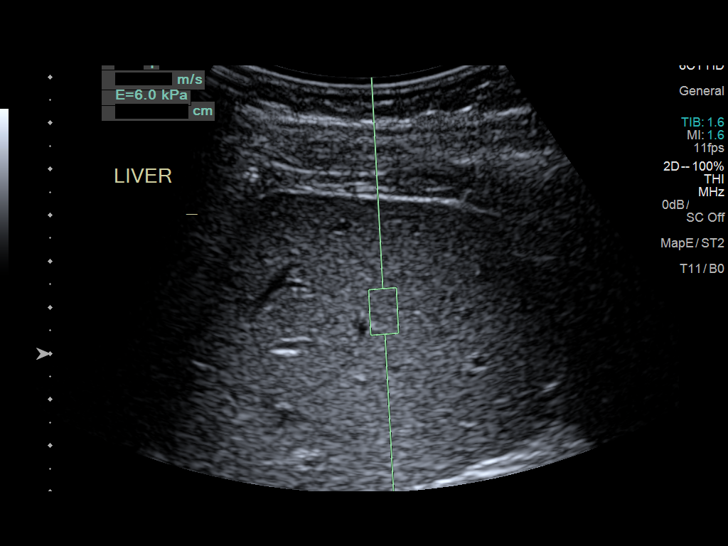
[im 13/13]
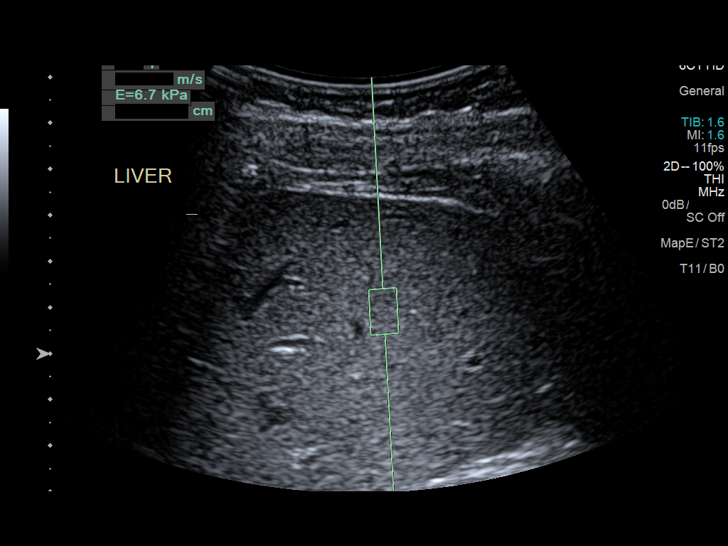

[13 of 13 positions shown; findings below may reference images not displayed]

FINDINGS: ULTRASOUND ABDOMEN LIMITED RIGHT UPPER QUADRANT

Gallbladder:

No gallstones or wall thickening visualized. No sonographic Murphy
sign noted.

Common bile duct:

Diameter: 5 mm, within normal limits.

Liver:

No focal lesion identified. Within normal limits in parenchymal
echogenicity. Portal vein is patent on color Doppler imaging with
normal direction of blood flow towards the liver.

ULTRASOUND HEPATIC ELASTOGRAPHY

Device: Siemens Helix VTQ

Patient position: Left Lateral Decubitus

Transducer 6C1

Number of measurements: 10

Hepatic segment:  8

Median velocity:   1.41 m/sec

IQR:

IQR/Median velocity ratio:

Corresponding Metavir fibrosis score:  F2 + some F3

Risk of fibrosis: Moderate

Limitations of exam: None

Please note that abnormal shear wave velocities may also be
identified in clinical settings other than with hepatic fibrosis,
such as: acute hepatitis, elevated right heart and central venous
pressures including use of beta blockers, Jumper disease
([place]), infiltrative processes such as
mastocytosis/amyloidosis/infiltrative tumor, extrahepatic
cholestasis, in the post-prandial state, and liver transplantation.
Correlation with patient history, laboratory data, and clinical
condition recommended.
IMPRESSION: ULTRASOUND ABDOMEN:
No hepatobiliary abnormality visualized.

ULTRASOUND HEPATIC ELASTOGRAHY:

Median hepatic shear wave velocity is calculated at 1.41 m/sec.

Corresponding Metavir fibrosis score is F2 + some F3.

Risk of fibrosis is Moderate.

Follow-up: Additional testing appropriate

## 2020-05-03 IMAGING — US US ABDOMEN LIMITED W/ ELASTOGRAPHY
1 series · 13 of 25 positions shown · non-contrast
Comparison: Abdomen ultrasound on 04/22/2013

CLINICAL DATA: Chronic hepatitis-C without hepatic coma.

EXAM:
US ABDOMEN LIMITED - RIGHT UPPER QUADRANT
ULTRASOUND HEPATIC ELASTOGRAPHY
TECHNIQUE: Limited right upper quadrant abdominal ultrasound was performed. In
addition, ultrasound elastography evaluation of the liver was
performed. A region of interest was placed in the right lobe of the
liver. Following application of a compressive sonographic pulse,
shear waves were detected in the adjacent hepatic tissue and the
shear wave velocity was calculated. Multiple assessments were
performed at the selected site. Median shear wave velocity is
correlated to a Metavir fibrosis score.

[Series 1: us abdomen limited w/ elastography · 0.19mm/px · 13 of 43 slices shown]
[im 1/43]
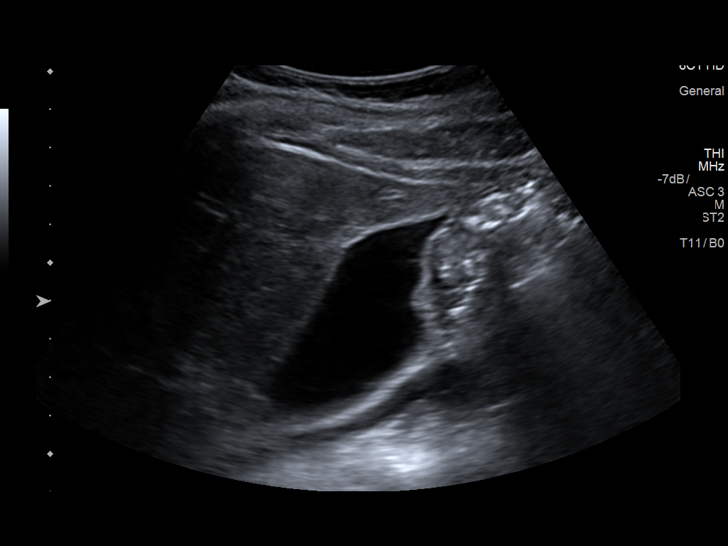
[im 4/43]
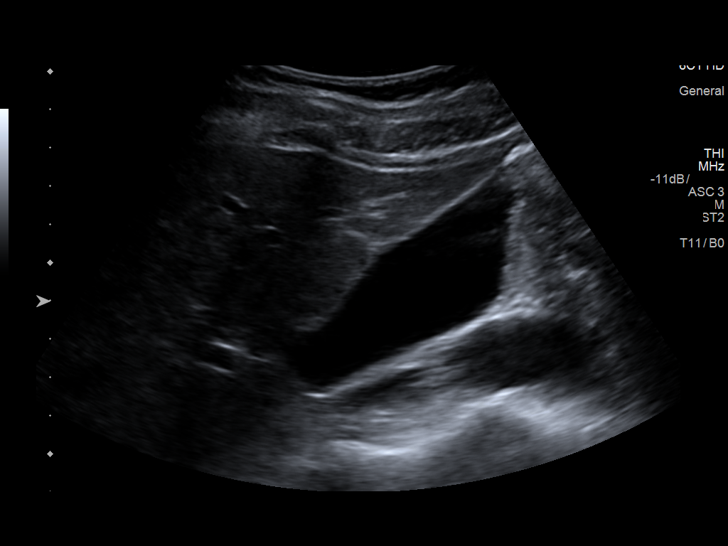
[im 8/43]
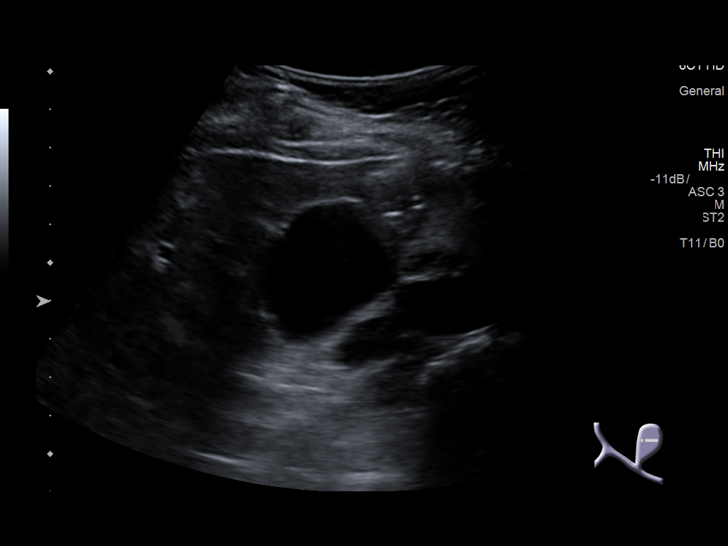
[im 11/43]
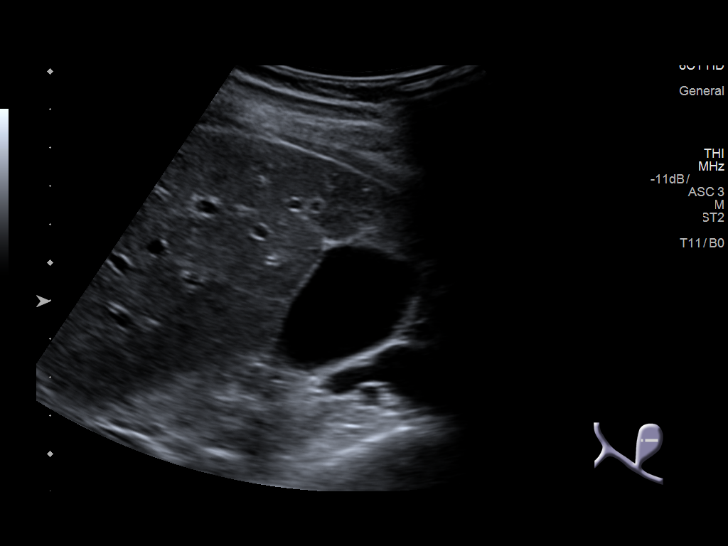
[im 15/43]
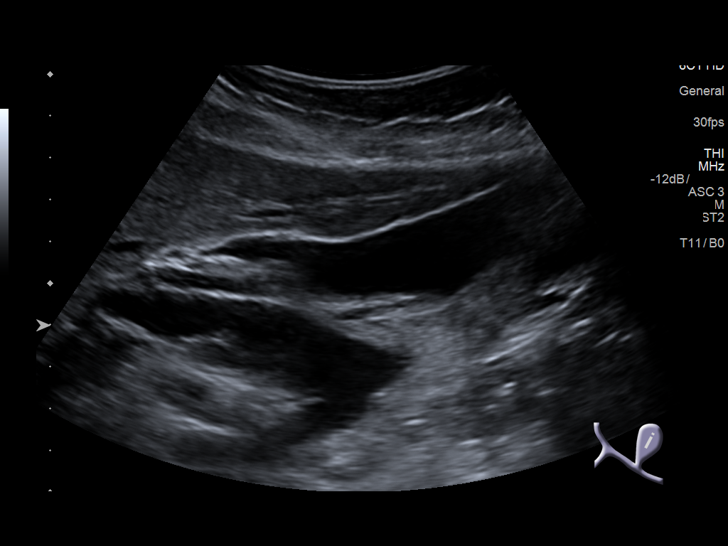
[im 18/43]
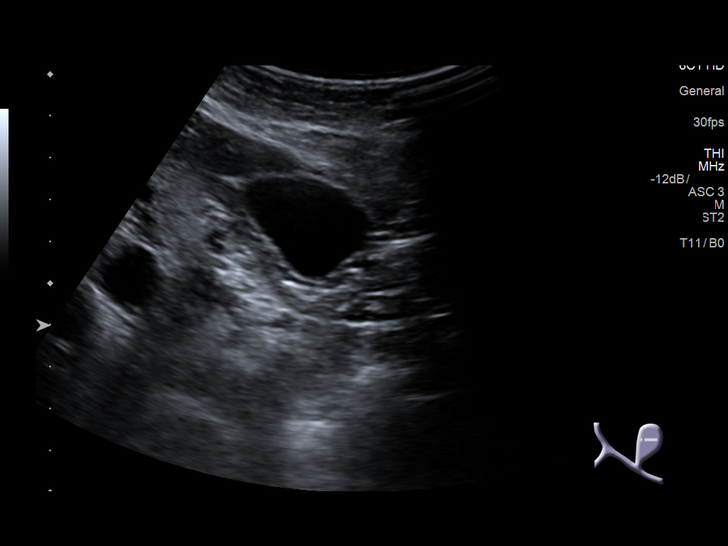
[im 22/43]
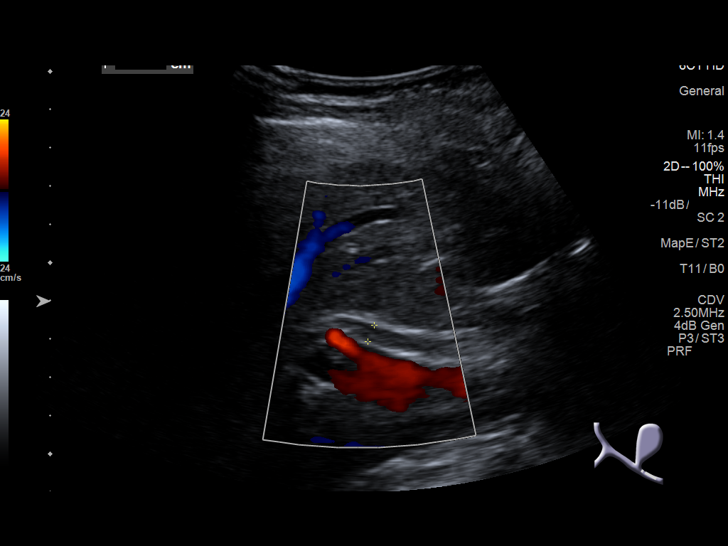
[im 25/43]
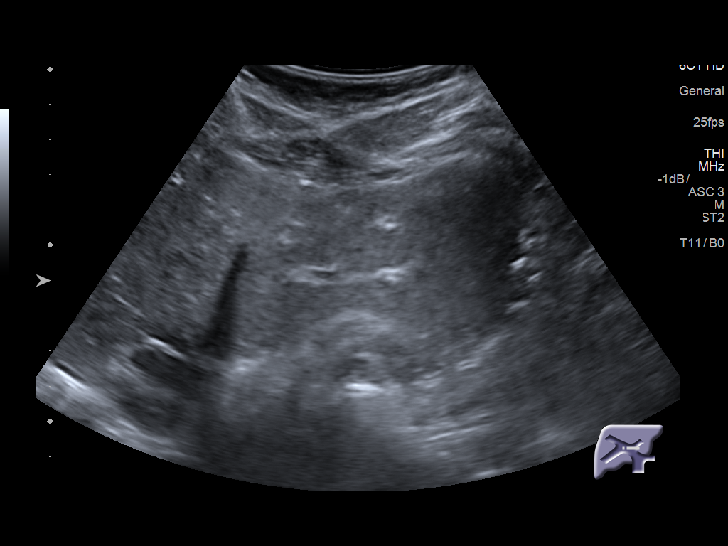
[im 29/43]
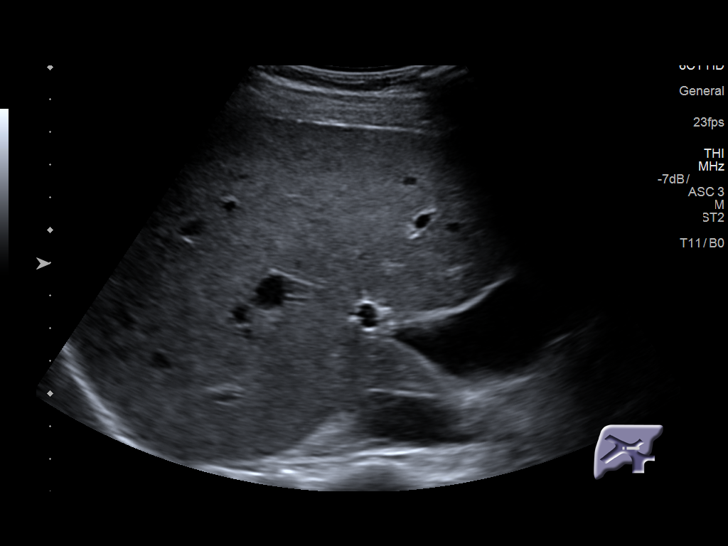
[im 32/43]
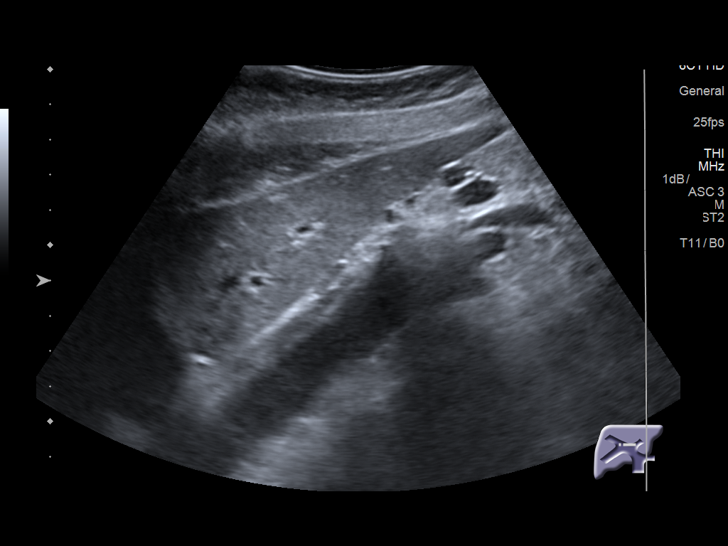
[im 36/43]
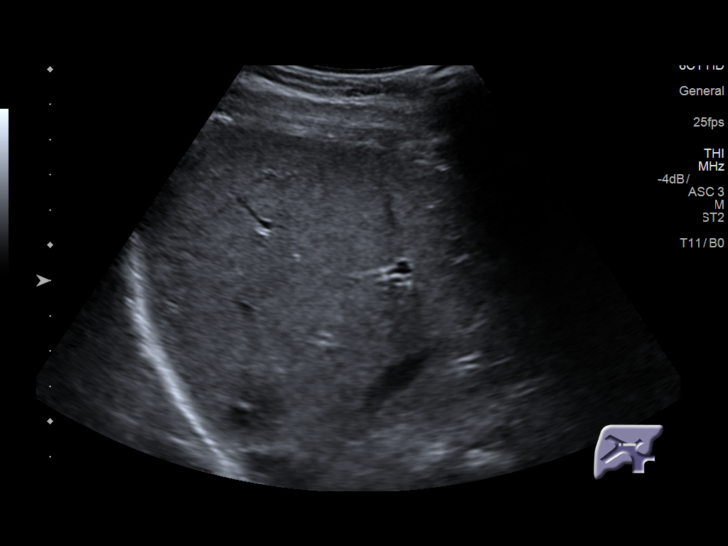
[im 39/43]
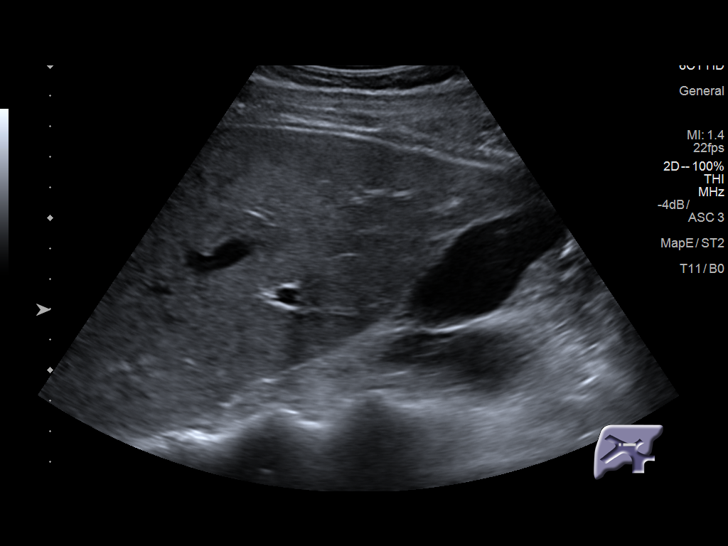
[im 43/43]
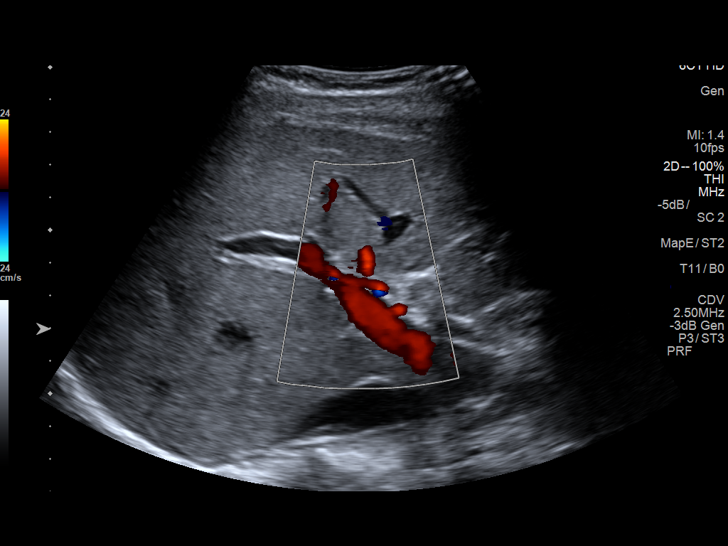

[13 of 25 positions shown; findings below may reference images not displayed]

FINDINGS: ULTRASOUND ABDOMEN LIMITED RIGHT UPPER QUADRANT

Gallbladder:

No gallstones or wall thickening visualized. No sonographic Murphy
sign noted.

Common bile duct:

Diameter: 5 mm, within normal limits.

Liver:

No focal lesion identified. Within normal limits in parenchymal
echogenicity. Portal vein is patent on color Doppler imaging with
normal direction of blood flow towards the liver.

ULTRASOUND HEPATIC ELASTOGRAPHY

Device: Siemens Helix VTQ

Patient position: Left Lateral Decubitus

Transducer [id number]

Number of measurements: 10

Hepatic segment:  8

Median velocity:   1.41 m/sec

IQR:

IQR/Median velocity ratio:

Corresponding Metavir fibrosis score:  F2 + some F3

Risk of fibrosis: Moderate

Limitations of exam: None

Please note that abnormal shear wave velocities may also be
identified in clinical settings other than with hepatic fibrosis,
such as: acute hepatitis, elevated right heart and central venous
pressures including use of beta blockers, Jumper disease
([place]), infiltrative processes such as
mastocytosis/amyloidosis/infiltrative tumor, extrahepatic
cholestasis, in the post-prandial state, and liver transplantation.
Correlation with patient history, laboratory data, and clinical
condition recommended.
IMPRESSION: ULTRASOUND ABDOMEN:
No hepatobiliary abnormality visualized.

ULTRASOUND HEPATIC ELASTOGRAHY:

Median hepatic shear wave velocity is calculated at 1.41 m/sec.

Corresponding Metavir fibrosis score is F2 + some F3.

Risk of fibrosis is Moderate.

Follow-up: Additional testing appropriate

## 2020-08-30 ENCOUNTER — Other Ambulatory Visit: Payer: Self-pay

## 2020-08-30 ENCOUNTER — Encounter (HOSPITAL_BASED_OUTPATIENT_CLINIC_OR_DEPARTMENT_OTHER): Payer: Self-pay | Admitting: Emergency Medicine

## 2020-08-30 ENCOUNTER — Emergency Department (HOSPITAL_BASED_OUTPATIENT_CLINIC_OR_DEPARTMENT_OTHER)
Admission: EM | Admit: 2020-08-30 | Discharge: 2020-08-30 | Disposition: A | Discharge status: Home / Self Care | Payer: 59 | Attending: Emergency Medicine | Admitting: Emergency Medicine

## 2020-08-30 ENCOUNTER — Emergency Department (HOSPITAL_BASED_OUTPATIENT_CLINIC_OR_DEPARTMENT_OTHER): Payer: 59

## 2020-08-30 DIAGNOSIS — W231XXA Caught, crushed, jammed, or pinched between stationary objects, initial encounter: Secondary | ICD-10-CM | POA: Insufficient documentation

## 2020-08-30 DIAGNOSIS — Z87891 Personal history of nicotine dependence: Secondary | ICD-10-CM | POA: Insufficient documentation

## 2020-08-30 DIAGNOSIS — S6991XA Unspecified injury of right wrist, hand and finger(s), initial encounter: Secondary | ICD-10-CM | POA: Diagnosis present

## 2020-08-30 DIAGNOSIS — S62632A Displaced fracture of distal phalanx of right middle finger, initial encounter for closed fracture: Secondary | ICD-10-CM | POA: Diagnosis not present

## 2020-08-30 MED ORDER — CEPHALEXIN 500 MG PO CAPS: 500.0000 mg | ORAL_CAPSULE | Freq: Two times a day (BID) | ORAL | 0 refills | Status: AC

## 2020-08-30 MED ORDER — LIDOCAINE-EPINEPHRINE (PF) 2 %-1:200000 IJ SOLN
10.0000 mL | Freq: Once | INTRAMUSCULAR | Status: DC
Start: 2020-08-30 — End: 2020-08-31
  Filled 2020-08-30: qty 20

## 2020-08-30 NOTE — Discharge Instructions (Signed)
Return for redness drainage or if you get a fever.  The sutures that were used should be removed between day 7 and 10.   The area can get wet but not fully immersed underwater.  No scrubbing.  If you really want to clean it you can apply a half-and-half hydrogen peroxide solution with water on a Q-tip.  You can apply an ointment a couple times a day this could be as simple as Vaseline but could also be an antibiotic ointment if you wish.

## 2020-08-30 NOTE — ED Triage Notes (Signed)
Pt via pov from home with finger injury; pt slammed finger in car door. Pt has laceration and some swelling o her right middle finger. Pt alert & oriented, nad noted.

## 2020-08-30 NOTE — ED Provider Notes (Signed)
[facility] Provider Note   CSN: [id number] Arrival date & time: 08/30/20  1811     History Chief Complaint  Patient presents with  . Finger Injury    Carla Singleton is a 40 y.o. female.  40 yo F with a chief complaints of a right middle finger injury.  The patient was closing her door of her car and got it stuck.  She denies other injury.  Happened a couple hours ago.  Tetanus is up-to-date.  The history is provided by the patient.  Hand Injury Location:  Finger Finger location:  R middle finger Injury: yes   Time since incident:  3 hours Mechanism of injury: crush   Crush injury:    Mechanism:  Door   Duration of crushing force:  2 seconds Pain details:    Quality:  Aching   Radiates to:  Does not radiate   Severity:  Moderate   Onset quality:  Gradual   Duration:  2 hours   Timing:  Constant Handedness:  Right-handed Dislocation: no   Foreign body present:  No foreign bodies Tetanus status:  Up to date Prior injury to area:  No Relieved by:  Nothing Worsened by:  Nothing Ineffective treatments:  None tried Associated symptoms: no fever        Past Medical History:  Diagnosis Date  . Kidney stones     Patient Active Problem List   Diagnosis Date Noted  . Vaccine counseling 09/26/2017  . Liver fibrosis 09/25/2017  . Chronic hepatitis C without hepatic coma ([place]) 09/17/2017  . Ectopic pregnancy 04/17/2013    Past Surgical History:  Procedure Laterality Date  . EYE SURGERY    . LAPAROSCOPY N/A 04/17/2013   Procedure: Operative Laparoscopy  Left Salpingectomy;  Surgeon: Emily Filbert, MD;  Location: [facility];  Service: Gynecology;  Laterality: N/A;     OB History    Gravida  2   Para  2   Term  2   Preterm      AB      Living  2     SAB      IAB      Ectopic      Multiple      Live Births  2           Family History  Problem Relation Age of Onset  . Stroke Maternal Grandmother   . Cancer  Maternal Grandfather     Social History   Tobacco Use  . Smoking status: Former Research scientist (life sciences)  . Smokeless tobacco: Never Used  Vaping Use  . Vaping Use: Never used  Substance Use Topics  . Alcohol use: Yes    Comment: occassionally  . Drug use: No    Home Medications Prior to Admission medications   Medication Sig Start Date End Date Taking? Authorizing Provider  cephALEXin (KEFLEX) 500 MG capsule Take 1 capsule (500 mg total) by mouth 2 (two) times daily. 08/30/20  Yes Deno Etienne, DO  fluticasone (FLONASE) 50 MCG/ACT nasal spray fluticasone propionate 50 mcg/actuation nasal spray,suspension  SHAKE LQ AND U 1 SPR IEN D 01/25/17   [provider]  HYDROcodone-acetaminophen (NORCO) 10-325 MG per tablet Take 1 tablet by mouth 2 (two) times daily.     [provider]  ibuprofen (ADVIL,MOTRIN) 800 MG tablet Take 1 tablet (800 mg total) by mouth every 8 (eight) hours as needed. Patient not taking: Reported on 10/29/2017 04/17/13   Emily Filbert, MD  Allergies    Patient has no known allergies.  Review of Systems   Review of Systems  Constitutional: Negative for chills and fever.  HENT: Negative for congestion and rhinorrhea.   Eyes: Negative for redness and visual disturbance.  Respiratory: Negative for shortness of breath and wheezing.   Cardiovascular: Negative for chest pain and palpitations.  Gastrointestinal: Negative for nausea and vomiting.  Genitourinary: Negative for dysuria and urgency.  Musculoskeletal: Negative for arthralgias and myalgias.  Skin: Positive for wound. Negative for pallor.  Neurological: Negative for dizziness and headaches.    Physical Exam Updated Vital Signs BP 135/86 (BP Location: Left Arm)   Pulse 72   Temp 98.2 F (36.8 C) (Oral)   Resp 18   Ht 5' 11.5" (1.816 m)   Wt 67.6 kg   SpO2 100%   BMI 20.50 kg/m   Physical Exam Vitals and nursing note reviewed.  Constitutional:      General: She is not in acute distress.     Appearance: She is well-developed. She is not diaphoretic.  HENT:     Head: Normocephalic and atraumatic.  Eyes:     Pupils: Pupils are equal, round, and reactive to light.  Cardiovascular:     Rate and Rhythm: Normal rate and regular rhythm.     Heart sounds: No murmur heard. No friction rub. No gallop.   Pulmonary:     Effort: Pulmonary effort is normal.     Breath sounds: No wheezing or rales.  Abdominal:     General: There is no distension.     Palpations: Abdomen is soft.     Tenderness: There is no abdominal tenderness.  Musculoskeletal:        General: Tenderness present.     Cervical back: Normal range of motion and neck supple.     Comments: Pain and swelling to the distal phalanx of the right third digit.  Laceration approximately 2.6 cm on the palmar aspect of the finger.  The nailbed appears to be intact but there is some edema and some ecchymosis along the radial aspect of the bed.  Skin:    General: Skin is warm and dry.  Neurological:     Mental Status: She is alert and oriented to person, place, and time.  Psychiatric:        Behavior: Behavior normal.     ED Results / Procedures / Treatments   Labs (all labs ordered are listed, but only abnormal results are displayed) Labs Reviewed - No data to display  EKG None  Radiology No results found.  Procedures .Marland KitchenLaceration Repair  Date/Time: 08/30/2020 7:35 PM Performed by: Deno Etienne, DO Authorized by: Deno Etienne, DO   Consent:    Consent obtained:  Verbal   Consent given by:  Patient   Risks, benefits, and alternatives were discussed: yes     Risks discussed:  Infection, pain, poor cosmetic result and poor wound healing   Alternatives discussed:  No treatment and delayed treatment Universal protocol:    Procedure explained and questions answered to patient or proxy's satisfaction: yes     Imaging studies available: yes     Immediately prior to procedure, a time out was called: yes     Patient  identity confirmed:  Verbally with patient Anesthesia:    Anesthesia method:  Nerve block   Block location:  Digital   Block needle gauge:  27 G   Block anesthetic:  Lidocaine 2% WITH epi   Block technique:  Digital  Block injection procedure:  Anatomic landmarks identified, anatomic landmarks palpated, introduced needle and negative aspiration for blood   Block outcome:  Anesthesia achieved Laceration details:    Location:  Finger   Finger location:  R long finger   Length (cm):  2.7 Exploration:    Limited defect created (wound extended): no     Hemostasis achieved with:  Epinephrine and direct pressure   Imaging obtained: x-ray     Imaging outcome: foreign body not noted     Wound exploration: entire depth of wound visualized     Wound extent: underlying fracture     Contaminated: no   Treatment:    Area cleansed with:  Povidone-iodine and saline   Amount of cleaning:  Extensive   Irrigation solution:  Sterile saline   Irrigation volume:  50   Irrigation method:  Syringe   Debridement:  None   Undermining:  None   Scar revision: no   Skin repair:    Repair method:  Sutures   Suture size:  4-0   Suture material:  Nylon   Suture technique:  Simple interrupted   Number of sutures:  5 Approximation:    Approximation:  Close Repair type:    Repair type:  Simple Post-procedure details:    Dressing:  Open (no dressing)   Procedure completion:  Tolerated well, no immediate complications     Medications Ordered in ED Medications  lidocaine-EPINEPHrine (XYLOCAINE W/EPI) 2 %-1:200000 (PF) injection 10 mL (has no administration in time range)    ED Course  I have reviewed the triage vital signs and the nursing notes.  Pertinent labs & imaging results that were available during my care of the patient were reviewed by me and considered in my medical decision making (see chart for details).    MDM Rules/Calculators/A&P                          40 yo F with a chief  complaint of a right middle finger injury.  Showed her finger in a car door.  Has a laceration to the palmar surface.  Will repair at bedside.  Will trephinate the fingernail.  7:37 PM:  I have discussed the diagnosis/risks/treatment options with the patient and believe the pt to be eligible for discharge home to follow-up with PCP. We also discussed returning to the ED immediately if new or worsening sx occur. We discussed the sx which are most concerning (e.g., sudden worsening pain, fever, inability to tolerate by mouth) that necessitate immediate return. Medications administered to the patient during their visit and any new prescriptions provided to the patient are listed below.  Medications given during this visit Medications  lidocaine-EPINEPHrine (XYLOCAINE W/EPI) 2 %-1:200000 (PF) injection 10 mL (has no administration in time range)     The patient appears reasonably screen and/or stabilized for discharge and I doubt any other medical condition or other [facility] requiring further screening, evaluation, or treatment in the ED at this time prior to discharge.   Final Clinical Impression(s) / ED Diagnoses Final diagnoses:  Closed displaced fracture of distal phalanx of right middle finger, initial encounter    Rx / DC Orders ED Discharge Orders         Ordered    cephALEXin (KEFLEX) 500 MG capsule  2 times daily        08/30/20 1931           Deno Etienne, DO 08/30/20 1937

## 2022-02-06 DIAGNOSIS — R635 Abnormal weight gain: Secondary | ICD-10-CM | POA: Diagnosis not present

## 2022-02-06 DIAGNOSIS — Z124 Encounter for screening for malignant neoplasm of cervix: Secondary | ICD-10-CM | POA: Diagnosis not present

## 2022-02-06 DIAGNOSIS — Z978 Presence of other specified devices: Secondary | ICD-10-CM | POA: Diagnosis not present

## 2022-02-06 DIAGNOSIS — Z130 Encounter for screening for diseases of the blood and blood-forming organs and certain disorders involving the immune mechanism: Secondary | ICD-10-CM | POA: Diagnosis not present

## 2022-02-06 DIAGNOSIS — R5383 Other fatigue: Secondary | ICD-10-CM | POA: Diagnosis not present

## 2022-02-06 DIAGNOSIS — Z1389 Encounter for screening for other disorder: Secondary | ICD-10-CM | POA: Diagnosis not present

## 2022-02-06 DIAGNOSIS — R309 Painful micturition, unspecified: Secondary | ICD-10-CM | POA: Diagnosis not present

## 2022-02-06 DIAGNOSIS — Z01419 Encounter for gynecological examination (general) (routine) without abnormal findings: Secondary | ICD-10-CM | POA: Diagnosis not present

## 2022-02-06 DIAGNOSIS — Z1272 Encounter for screening for malignant neoplasm of vagina: Secondary | ICD-10-CM | POA: Diagnosis not present

## 2022-02-06 DIAGNOSIS — Z0142 Encounter for cervical smear to confirm findings of recent normal smear following initial abnormal smear: Secondary | ICD-10-CM | POA: Diagnosis not present

## 2022-02-07 DIAGNOSIS — Z1272 Encounter for screening for malignant neoplasm of vagina: Secondary | ICD-10-CM | POA: Diagnosis not present

## 2022-02-07 DIAGNOSIS — Z124 Encounter for screening for malignant neoplasm of cervix: Secondary | ICD-10-CM | POA: Diagnosis not present

## 2022-02-07 DIAGNOSIS — Z0142 Encounter for cervical smear to confirm findings of recent normal smear following initial abnormal smear: Secondary | ICD-10-CM | POA: Diagnosis not present

## 2022-02-07 DIAGNOSIS — R309 Painful micturition, unspecified: Secondary | ICD-10-CM | POA: Diagnosis not present

## 2022-02-07 DIAGNOSIS — Z1231 Encounter for screening mammogram for malignant neoplasm of breast: Secondary | ICD-10-CM | POA: Diagnosis not present

## 2022-02-14 DIAGNOSIS — X58XXXD Exposure to other specified factors, subsequent encounter: Secondary | ICD-10-CM | POA: Diagnosis not present

## 2022-02-14 DIAGNOSIS — S01311D Laceration without foreign body of right ear, subsequent encounter: Secondary | ICD-10-CM | POA: Diagnosis not present

## 2022-04-01 DIAGNOSIS — R69 Illness, unspecified: Secondary | ICD-10-CM | POA: Diagnosis not present

## 2022-04-06 DIAGNOSIS — R69 Illness, unspecified: Secondary | ICD-10-CM | POA: Diagnosis not present

## 2022-05-15 DIAGNOSIS — R69 Illness, unspecified: Secondary | ICD-10-CM | POA: Diagnosis not present

## 2022-05-18 DIAGNOSIS — Z1329 Encounter for screening for other suspected endocrine disorder: Secondary | ICD-10-CM | POA: Diagnosis not present

## 2022-05-18 DIAGNOSIS — Z13220 Encounter for screening for lipoid disorders: Secondary | ICD-10-CM | POA: Diagnosis not present

## 2022-05-18 DIAGNOSIS — Z130 Encounter for screening for diseases of the blood and blood-forming organs and certain disorders involving the immune mechanism: Secondary | ICD-10-CM | POA: Diagnosis not present

## 2022-05-18 DIAGNOSIS — E569 Vitamin deficiency, unspecified: Secondary | ICD-10-CM | POA: Diagnosis not present

## 2022-05-18 DIAGNOSIS — N926 Irregular menstruation, unspecified: Secondary | ICD-10-CM | POA: Diagnosis not present

## 2022-06-24 DIAGNOSIS — R69 Illness, unspecified: Secondary | ICD-10-CM | POA: Diagnosis not present

## 2022-06-24 DIAGNOSIS — F1123 Opioid dependence with withdrawal: Secondary | ICD-10-CM | POA: Diagnosis not present

## 2022-07-01 DIAGNOSIS — F1123 Opioid dependence with withdrawal: Secondary | ICD-10-CM | POA: Diagnosis not present

## 2022-07-08 DIAGNOSIS — F1123 Opioid dependence with withdrawal: Secondary | ICD-10-CM | POA: Diagnosis not present

## 2022-07-15 DIAGNOSIS — F1123 Opioid dependence with withdrawal: Secondary | ICD-10-CM | POA: Diagnosis not present

## 2022-07-17 DIAGNOSIS — E559 Vitamin D deficiency, unspecified: Secondary | ICD-10-CM | POA: Diagnosis not present

## 2022-07-17 DIAGNOSIS — Z6827 Body mass index (BMI) 27.0-27.9, adult: Secondary | ICD-10-CM | POA: Diagnosis not present

## 2022-07-17 DIAGNOSIS — Z713 Dietary counseling and surveillance: Secondary | ICD-10-CM | POA: Diagnosis not present

## 2022-07-17 DIAGNOSIS — E538 Deficiency of other specified B group vitamins: Secondary | ICD-10-CM | POA: Diagnosis not present

## 2022-07-22 DIAGNOSIS — F1123 Opioid dependence with withdrawal: Secondary | ICD-10-CM | POA: Diagnosis not present

## 2022-07-25 DIAGNOSIS — E538 Deficiency of other specified B group vitamins: Secondary | ICD-10-CM | POA: Diagnosis not present

## 2022-07-29 DIAGNOSIS — F1123 Opioid dependence with withdrawal: Secondary | ICD-10-CM | POA: Diagnosis not present

## 2022-08-01 DIAGNOSIS — Z3046 Encounter for surveillance of implantable subdermal contraceptive: Secondary | ICD-10-CM | POA: Diagnosis not present

## 2022-08-01 DIAGNOSIS — E538 Deficiency of other specified B group vitamins: Secondary | ICD-10-CM | POA: Diagnosis not present

## 2022-08-05 DIAGNOSIS — F1123 Opioid dependence with withdrawal: Secondary | ICD-10-CM | POA: Diagnosis not present

## 2022-08-08 DIAGNOSIS — E538 Deficiency of other specified B group vitamins: Secondary | ICD-10-CM | POA: Diagnosis not present

## 2022-08-12 DIAGNOSIS — F1123 Opioid dependence with withdrawal: Secondary | ICD-10-CM | POA: Diagnosis not present

## 2022-08-14 DIAGNOSIS — R1010 Upper abdominal pain, unspecified: Secondary | ICD-10-CM | POA: Diagnosis not present

## 2022-08-14 DIAGNOSIS — R109 Unspecified abdominal pain: Secondary | ICD-10-CM | POA: Diagnosis not present

## 2022-08-15 DIAGNOSIS — E538 Deficiency of other specified B group vitamins: Secondary | ICD-10-CM | POA: Diagnosis not present

## 2022-08-19 DIAGNOSIS — F1123 Opioid dependence with withdrawal: Secondary | ICD-10-CM | POA: Diagnosis not present

## 2022-08-22 DIAGNOSIS — E538 Deficiency of other specified B group vitamins: Secondary | ICD-10-CM | POA: Diagnosis not present

## 2022-08-26 DIAGNOSIS — F1123 Opioid dependence with withdrawal: Secondary | ICD-10-CM | POA: Diagnosis not present

## 2022-08-29 DIAGNOSIS — E538 Deficiency of other specified B group vitamins: Secondary | ICD-10-CM | POA: Diagnosis not present

## 2022-09-02 DIAGNOSIS — F1123 Opioid dependence with withdrawal: Secondary | ICD-10-CM | POA: Diagnosis not present

## 2022-09-09 DIAGNOSIS — F1123 Opioid dependence with withdrawal: Secondary | ICD-10-CM | POA: Diagnosis not present

## 2022-09-12 DIAGNOSIS — E538 Deficiency of other specified B group vitamins: Secondary | ICD-10-CM | POA: Diagnosis not present

## 2022-09-16 DIAGNOSIS — F1123 Opioid dependence with withdrawal: Secondary | ICD-10-CM | POA: Diagnosis not present

## 2022-09-23 DIAGNOSIS — F1123 Opioid dependence with withdrawal: Secondary | ICD-10-CM | POA: Diagnosis not present

## 2022-09-30 DIAGNOSIS — F1123 Opioid dependence with withdrawal: Secondary | ICD-10-CM | POA: Diagnosis not present

## 2022-10-07 DIAGNOSIS — F1123 Opioid dependence with withdrawal: Secondary | ICD-10-CM | POA: Diagnosis not present

## 2022-10-14 DIAGNOSIS — F1123 Opioid dependence with withdrawal: Secondary | ICD-10-CM | POA: Diagnosis not present

## 2022-10-21 DIAGNOSIS — F1123 Opioid dependence with withdrawal: Secondary | ICD-10-CM | POA: Diagnosis not present

## 2022-10-28 DIAGNOSIS — F1123 Opioid dependence with withdrawal: Secondary | ICD-10-CM | POA: Diagnosis not present

## 2022-11-04 DIAGNOSIS — F1123 Opioid dependence with withdrawal: Secondary | ICD-10-CM | POA: Diagnosis not present

## 2022-11-11 DIAGNOSIS — F1123 Opioid dependence with withdrawal: Secondary | ICD-10-CM | POA: Diagnosis not present

## 2022-11-18 DIAGNOSIS — F1123 Opioid dependence with withdrawal: Secondary | ICD-10-CM | POA: Diagnosis not present

## 2022-11-20 DIAGNOSIS — Z713 Dietary counseling and surveillance: Secondary | ICD-10-CM | POA: Diagnosis not present

## 2022-11-25 DIAGNOSIS — F1123 Opioid dependence with withdrawal: Secondary | ICD-10-CM | POA: Diagnosis not present

## 2022-12-02 DIAGNOSIS — F1123 Opioid dependence with withdrawal: Secondary | ICD-10-CM | POA: Diagnosis not present

## 2022-12-09 DIAGNOSIS — F1123 Opioid dependence with withdrawal: Secondary | ICD-10-CM | POA: Diagnosis not present

## 2022-12-17 DIAGNOSIS — F1120 Opioid dependence, uncomplicated: Secondary | ICD-10-CM | POA: Diagnosis not present

## 2022-12-24 DIAGNOSIS — F1120 Opioid dependence, uncomplicated: Secondary | ICD-10-CM | POA: Diagnosis not present

## 2022-12-31 DIAGNOSIS — F1120 Opioid dependence, uncomplicated: Secondary | ICD-10-CM | POA: Diagnosis not present

## 2023-01-07 DIAGNOSIS — F1120 Opioid dependence, uncomplicated: Secondary | ICD-10-CM | POA: Diagnosis not present

## 2023-01-14 DIAGNOSIS — F1120 Opioid dependence, uncomplicated: Secondary | ICD-10-CM | POA: Diagnosis not present

## 2023-01-21 DIAGNOSIS — F1120 Opioid dependence, uncomplicated: Secondary | ICD-10-CM | POA: Diagnosis not present

## 2023-01-28 DIAGNOSIS — F1120 Opioid dependence, uncomplicated: Secondary | ICD-10-CM | POA: Diagnosis not present

## 2023-02-04 DIAGNOSIS — F1120 Opioid dependence, uncomplicated: Secondary | ICD-10-CM | POA: Diagnosis not present

## 2023-02-11 DIAGNOSIS — F1120 Opioid dependence, uncomplicated: Secondary | ICD-10-CM | POA: Diagnosis not present

## 2023-02-18 DIAGNOSIS — F1120 Opioid dependence, uncomplicated: Secondary | ICD-10-CM | POA: Diagnosis not present

## 2023-02-20 DIAGNOSIS — R1031 Right lower quadrant pain: Secondary | ICD-10-CM | POA: Diagnosis not present

## 2023-02-20 DIAGNOSIS — Z23 Encounter for immunization: Secondary | ICD-10-CM | POA: Diagnosis not present

## 2023-02-25 DIAGNOSIS — F1120 Opioid dependence, uncomplicated: Secondary | ICD-10-CM | POA: Diagnosis not present

## 2023-03-04 DIAGNOSIS — F1120 Opioid dependence, uncomplicated: Secondary | ICD-10-CM | POA: Diagnosis not present

## 2023-03-11 DIAGNOSIS — F1120 Opioid dependence, uncomplicated: Secondary | ICD-10-CM | POA: Diagnosis not present

## 2023-03-18 DIAGNOSIS — F1120 Opioid dependence, uncomplicated: Secondary | ICD-10-CM | POA: Diagnosis not present

## 2023-03-25 DIAGNOSIS — F1120 Opioid dependence, uncomplicated: Secondary | ICD-10-CM | POA: Diagnosis not present

## 2023-03-26 DIAGNOSIS — R102 Pelvic and perineal pain: Secondary | ICD-10-CM | POA: Diagnosis not present

## 2023-03-26 DIAGNOSIS — Z978 Presence of other specified devices: Secondary | ICD-10-CM | POA: Diagnosis not present

## 2023-03-26 DIAGNOSIS — Z01419 Encounter for gynecological examination (general) (routine) without abnormal findings: Secondary | ICD-10-CM | POA: Diagnosis not present

## 2023-03-26 DIAGNOSIS — Z130 Encounter for screening for diseases of the blood and blood-forming organs and certain disorders involving the immune mechanism: Secondary | ICD-10-CM | POA: Diagnosis not present

## 2023-03-26 DIAGNOSIS — Z1231 Encounter for screening mammogram for malignant neoplasm of breast: Secondary | ICD-10-CM | POA: Diagnosis not present

## 2023-03-26 DIAGNOSIS — Z1389 Encounter for screening for other disorder: Secondary | ICD-10-CM | POA: Diagnosis not present

## 2023-03-26 DIAGNOSIS — Z0000 Encounter for general adult medical examination without abnormal findings: Secondary | ICD-10-CM | POA: Diagnosis not present

## 2023-03-26 DIAGNOSIS — Z7189 Other specified counseling: Secondary | ICD-10-CM | POA: Diagnosis not present

## 2023-03-26 DIAGNOSIS — F520 Hypoactive sexual desire disorder: Secondary | ICD-10-CM | POA: Diagnosis not present

## 2023-03-26 DIAGNOSIS — R6882 Decreased libido: Secondary | ICD-10-CM | POA: Diagnosis not present

## 2023-04-01 DIAGNOSIS — F1120 Opioid dependence, uncomplicated: Secondary | ICD-10-CM | POA: Diagnosis not present

## 2023-04-08 DIAGNOSIS — F1120 Opioid dependence, uncomplicated: Secondary | ICD-10-CM | POA: Diagnosis not present

## 2023-04-11 ENCOUNTER — Other Ambulatory Visit: Payer: Self-pay | Admitting: Obstetrics and Gynecology

## 2023-04-11 DIAGNOSIS — R928 Other abnormal and inconclusive findings on diagnostic imaging of breast: Secondary | ICD-10-CM

## 2023-04-15 DIAGNOSIS — F1120 Opioid dependence, uncomplicated: Secondary | ICD-10-CM | POA: Diagnosis not present

## 2023-04-22 DIAGNOSIS — F1120 Opioid dependence, uncomplicated: Secondary | ICD-10-CM | POA: Diagnosis not present

## 2023-04-29 ENCOUNTER — Ambulatory Visit
Admission: RE | Admit: 2023-04-29 | Discharge: 2023-04-29 | Disposition: A | Discharge status: Home / Self Care | Payer: Self-pay | Source: Ambulatory Visit | Attending: Obstetrics and Gynecology | Admitting: Obstetrics and Gynecology

## 2023-04-29 ENCOUNTER — Ambulatory Visit
Admission: RE | Admit: 2023-04-29 | Discharge: 2023-04-29 | Disposition: A | Discharge status: Home / Self Care | Payer: 59 | Source: Ambulatory Visit | Attending: Obstetrics and Gynecology | Admitting: Obstetrics and Gynecology

## 2023-04-29 DIAGNOSIS — R928 Other abnormal and inconclusive findings on diagnostic imaging of breast: Secondary | ICD-10-CM

## 2023-04-29 DIAGNOSIS — F1120 Opioid dependence, uncomplicated: Secondary | ICD-10-CM | POA: Diagnosis not present

## 2023-05-06 DIAGNOSIS — F1120 Opioid dependence, uncomplicated: Secondary | ICD-10-CM | POA: Diagnosis not present
